# Patient Record
Sex: Female | Born: 1965 | Race: White | Hispanic: No | Marital: Married | Smoking: Former smoker
Health system: Southern US, Community
[De-identification: ages and names within clinical notes are randomized; demographics above are authoritative.]

## PROBLEM LIST (undated history)

## (undated) DIAGNOSIS — F419 Anxiety disorder, unspecified: Secondary | ICD-10-CM

## (undated) DIAGNOSIS — G43909 Migraine, unspecified, not intractable, without status migrainosus: Secondary | ICD-10-CM

## (undated) DIAGNOSIS — I10 Essential (primary) hypertension: Secondary | ICD-10-CM

## (undated) HISTORY — DX: Essential (primary) hypertension: I10

## (undated) HISTORY — DX: Anxiety disorder, unspecified: F41.9

## (undated) HISTORY — DX: Migraine, unspecified, not intractable, without status migrainosus: G43.909

---

## 2009-04-17 ENCOUNTER — Emergency Department: Payer: Self-pay | Admitting: Emergency Medicine

## 2009-08-05 ENCOUNTER — Emergency Department: Payer: Self-pay | Admitting: Emergency Medicine

## 2010-08-29 ENCOUNTER — Emergency Department: Payer: Self-pay | Admitting: Emergency Medicine

## 2012-10-12 ENCOUNTER — Emergency Department: Payer: Self-pay | Admitting: Emergency Medicine

## 2012-10-12 LAB — BASIC METABOLIC PANEL
BUN: 7 mg/dL (ref 7–18)
Calcium, Total: 9.6 mg/dL (ref 8.5–10.1)
Chloride: 101 mmol/L (ref 98–107)
Creatinine: 1 mg/dL (ref 0.60–1.30)
EGFR (African American): >60
EGFR (Non-African Amer.): >60
Glucose: 106 mg/dL — ABNORMAL HIGH (ref 65–99)
Osmolality: 267 (ref 275–301)
Potassium: 3.3 mmol/L — ABNORMAL LOW (ref 3.5–5.1)
Sodium: 134 mmol/L — ABNORMAL LOW (ref 136–145)

## 2012-10-12 LAB — TROPONIN I: Troponin-I: <0.02 ng/mL

## 2012-10-12 LAB — SEDIMENTATION RATE: Erythrocyte Sed Rate: 5 mm/hr (ref 0–20)

## 2012-10-12 LAB — CBC
MCH: 27.9 pg (ref 26.0–34.0)
WBC: 9.9 10*3/uL (ref 3.6–11.0)

## 2012-10-25 ENCOUNTER — Emergency Department: Payer: Self-pay | Admitting: Emergency Medicine

## 2012-10-25 LAB — COMPREHENSIVE METABOLIC PANEL
Alkaline Phosphatase: 110 U/L (ref 50–136)
Anion Gap: 10 (ref 7–16)
Creatinine: 0.8 mg/dL (ref 0.60–1.30)
EGFR (African American): >60
EGFR (Non-African Amer.): >60
Glucose: 108 mg/dL — ABNORMAL HIGH (ref 65–99)
SGOT(AST): 15 U/L (ref 15–37)
Total Protein: 7.2 g/dL (ref 6.4–8.2)

## 2012-10-25 LAB — CBC
HCT: 39.5 % (ref 35.0–47.0)
HGB: 13.4 g/dL (ref 12.0–16.0)
MCH: 28 pg (ref 26.0–34.0)
MCHC: 33.9 g/dL (ref 32.0–36.0)
Platelet: 469 10*3/uL — ABNORMAL HIGH (ref 150–440)
RBC: 4.78 10*6/uL (ref 3.80–5.20)
RDW: 16.3 % — ABNORMAL HIGH (ref 11.5–14.5)

## 2012-10-25 LAB — CK TOTAL AND CKMB (NOT AT ARMC): CK, Total: 47 U/L (ref 21–215)

## 2013-05-01 ENCOUNTER — Emergency Department: Payer: BC Managed Care – PPO

## 2013-05-01 ENCOUNTER — Emergency Department
Admission: EM | Admit: 2013-05-01 | Discharge: 2013-05-01 | Disposition: A | Discharge status: Home / Self Care | Payer: BC Managed Care – PPO | Attending: Emergency Medicine | Admitting: Emergency Medicine

## 2013-05-01 DIAGNOSIS — I10 Essential (primary) hypertension: Secondary | ICD-10-CM | POA: Insufficient documentation

## 2013-05-01 DIAGNOSIS — R45851 Suicidal ideations: Secondary | ICD-10-CM | POA: Insufficient documentation

## 2013-05-01 DIAGNOSIS — R51 Headache: Secondary | ICD-10-CM | POA: Insufficient documentation

## 2013-05-01 DIAGNOSIS — F3289 Other specified depressive episodes: Secondary | ICD-10-CM | POA: Insufficient documentation

## 2013-05-01 DIAGNOSIS — F32A Depression, unspecified: Secondary | ICD-10-CM

## 2013-05-01 LAB — HCG, SERUM, QUALITATIVE: BHCG Qualitative: NEGATIVE

## 2013-05-01 LAB — CBC AND DIFFERENTIAL
Basophils %: 0.5 % (ref 0.0–3.0)
Basophils Absolute: 0.1 10*3/uL (ref 0.0–0.3)
Eosinophils %: 0.4 % (ref 0.0–7.0)
Eosinophils Absolute: 0 10*3/uL (ref 0.0–0.8)
Hematocrit: 43.7 % (ref 36.0–48.0)
Hemoglobin: 14.7 gm/dL (ref 12.0–16.0)
Lymphocytes Absolute: 1.2 10*3/uL (ref 0.6–5.1)
Lymphocytes: 10.3 % — ABNORMAL LOW (ref 15.0–46.0)
MCH: 28 pg (ref 28–35)
MCHC: 34 gm/dL (ref 32–36)
MCV: 85 fL (ref 80–100)
MPV: 8.2 fL (ref 6.0–10.0)
Monocytes Absolute: 0.6 10*3/uL (ref 0.1–1.7)
Monocytes: 5.1 % (ref 3.0–15.0)
Neutrophils %: 83.7 % — ABNORMAL HIGH (ref 42.0–78.0)
Neutrophils Absolute: 10 10*3/uL — ABNORMAL HIGH (ref 1.7–8.6)
PLT CT: 379 10*3/uL (ref 130–440)
RBC: 5.17 10*6/uL — ABNORMAL HIGH (ref 3.80–5.00)
RDW: 13.4 % (ref 11.0–14.0)
WBC: 11.9 10*3/uL — ABNORMAL HIGH (ref 4.0–11.0)

## 2013-05-01 LAB — COMPREHENSIVE METABOLIC PANEL
ALT: 14 U/L (ref 0–55)
AST (SGOT): 14 U/L (ref 10–42)
Albumin/Globulin Ratio: 1.15 Ratio (ref 0.70–1.50)
Albumin: 3.8 gm/dL (ref 3.5–5.0)
Alkaline Phosphatase: 74 U/L (ref 40–145)
Anion Gap: 15.8 mMol/L (ref 7.0–18.0)
BUN / Creatinine Ratio: 8.5 Ratio — ABNORMAL LOW (ref 10.0–30.0)
BUN: 6 mg/dL — ABNORMAL LOW (ref 7–22)
Bilirubin, Total: 0.5 mg/dL (ref 0.1–1.2)
CO2: 20.1 mMol/L (ref 20.0–30.0)
Calcium: 10 mg/dL (ref 8.5–10.5)
Chloride: 106 mMol/L (ref 98–110)
Creatinine: 0.71 mg/dL (ref 0.60–1.20)
EGFR: >60 mL/min/{1.73_m2}
Globulin: 3.3 gm/dL (ref 2.0–4.0)
Glucose: 108 mg/dL — ABNORMAL HIGH (ref 70–99)
Osmolality Calc: 274 mOsm/kg — ABNORMAL LOW (ref 275–300)
Potassium: 3.9 mMol/L (ref 3.5–5.3)
Protein, Total: 7.1 gm/dL (ref 6.0–8.3)
Sodium: 138 mMol/L (ref 136–147)

## 2013-05-01 LAB — VH URINE DRUG SCREEN
Amphetamine: NEGATIVE
Barbiturates: NEGATIVE
Buprenorphine, Urine: NEGATIVE
Cannabinoids: NEGATIVE
Cocaine: NEGATIVE
Opiates: NEGATIVE
Phencyclidine: NEGATIVE
Urine Benzodiazepines: POSITIVE — AB
Urine Creatinine Random: 41.96 mg/dL
Urine Methadone Screen: NEGATIVE
Urine Oxycodone: NEGATIVE
Urine Specific Gravity: 1.004 (ref 1.001–1.040)
pH, Urine: 6.5 pH (ref 5.0–8.0)

## 2013-05-01 LAB — URINALYSIS
Bilirubin, UA: NEGATIVE
Blood, UA: NEGATIVE
Glucose, UA: NEGATIVE mg/dL
Ketones UA: NEGATIVE mg/dL
Leukocyte Esterase, UA: NEGATIVE Leu/uL
Nitrite, UA: NEGATIVE
Protein, UR: NEGATIVE mg/dL
RBC, UA: 2 /hpf (ref 0–5)
Squam Epithel, UA: 11 /hpf — ABNORMAL HIGH (ref 0–2)
Urine Specific Gravity: 1.004 (ref 1.001–1.040)
Urobilinogen, UA: NORMAL mg/dL
WBC, UA: <1 /hpf (ref 0–4)
pH, Urine: 7 pH (ref 5.0–8.0)

## 2013-05-01 LAB — TSH: TSH: 0.13 u[IU]/mL — ABNORMAL LOW (ref 0.40–4.20)

## 2013-05-01 LAB — ACETAMINOPHEN LEVEL: Acetaminophen Level: <3 ug/mL — ABNORMAL LOW (ref 10.0–30.0)

## 2013-05-01 LAB — SALICYLATE LEVEL: Salicylate Level: <5 mg/dL — ABNORMAL LOW (ref 5.0–30.0)

## 2013-05-01 LAB — ETHANOL: Alcohol: <10 mg/dL (ref 0–9)

## 2013-05-01 MED ORDER — TRAMADOL HCL 50 MG PO TABS
50.0000 mg | ORAL_TABLET | Freq: Three times a day (TID) | ORAL | Status: DC | PRN
Start: 2013-05-01 — End: 2013-05-24

## 2013-05-01 MED ORDER — TRAMADOL HCL 50 MG PO TABS
50.0000 mg | ORAL_TABLET | Freq: Once | ORAL | Status: AC
Start: 2013-05-01 — End: 2013-05-01
  Administered 2013-05-01: 50 mg via ORAL

## 2013-05-01 MED ORDER — METOPROLOL SUCCINATE ER 50 MG PO TB24
50.0000 mg | ORAL_TABLET | Freq: Every day | ORAL | Status: AC
Start: 2013-05-01 — End: ?

## 2013-05-01 MED ORDER — LORAZEPAM 0.5 MG PO TABS
ORAL_TABLET | ORAL | Status: AC
Start: 2013-05-01 — End: ?
  Filled 2013-05-01: qty 2

## 2013-05-01 MED ORDER — METOPROLOL TARTRATE 50 MG PO TABS
ORAL_TABLET | ORAL | Status: AC
Start: 2013-05-01 — End: ?
  Filled 2013-05-01: qty 1

## 2013-05-01 MED ORDER — LORAZEPAM 0.5 MG PO TABS
1.0000 mg | ORAL_TABLET | Freq: Once | ORAL | Status: AC
Start: 2013-05-01 — End: 2013-05-01
  Administered 2013-05-01: 1 mg via ORAL

## 2013-05-01 MED ORDER — METOPROLOL SUCCINATE ER 50 MG PO TB24
50.0000 mg | ORAL_TABLET | Freq: Once | ORAL | Status: AC
Start: 2013-05-01 — End: 2013-05-01
  Administered 2013-05-01: 50 mg via ORAL
  Filled 2013-05-01: qty 1

## 2013-05-01 MED ORDER — TRAMADOL HCL 50 MG PO TABS
ORAL_TABLET | ORAL | Status: AC
Start: 2013-05-01 — End: ?
  Filled 2013-05-01: qty 1

## 2013-05-01 NOTE — ED Provider Notes (Signed)
[facility] EMERGENCY DEPARTMENT History and Physical Exam      Patient Name: [name],Marie A  Encounter Date:  05/01/2013  Attending Physician: Myna Bright, MD  PCP: Christa See, MD  Patient DOB:  [birth year]  MRN:  [id number]  Room:  F81/W29-H      History of Presenting Illness     Chief complaint: Suicidal thoughts    HPI/ROS is limited by: none  HPI/ROS given by: patient and family    Location: general (suicidal thoughts)  Duration: 1 week  Severity: severe    Marie [name] is a 48 y.o. female who presents with depression and suicidal thoughts. The patient's husband states that her mother died last week. Since that time she has had severe depression. Her husband states that she has not gotten out of bed in the last 2 days. The patient states" I just don't want to be here". She states that she does not want to live without her mother. She is very upset because she feels that she is a bad mother and that she cannot even take care of herself. Her only physical complaint is of a headache. She denies any cough congestion or fever. She denies any attempt at harming herself. She has a history of panic attacks but no history of suicidal thoughts. She has no other specific complaints.      Review of Systems     Review of Systems   Constitutional: Negative for fever, chills, diaphoresis and fatigue.   HENT: Negative for congestion, dental problem, ear pain, nosebleeds and sore throat.    Eyes: Negative for pain, discharge, redness and itching.   Respiratory: Negative for cough, chest tightness, shortness of breath and wheezing.    Cardiovascular: Negative for chest pain, palpitations and leg swelling.   Gastrointestinal: Negative for nausea, vomiting, abdominal pain, diarrhea, constipation and blood in stool.   Genitourinary: Negative for dysuria, urgency, frequency, hematuria, flank pain, decreased urine volume and difficulty urinating.   Musculoskeletal: Negative for back pain, joint swelling, myalgias and  neck pain.   Skin: Negative for pallor, rash and wound.   Neurological: Positive for headaches. Negative for dizziness, syncope, speech difficulty, weakness and numbness.   Hematological: Negative for adenopathy. Does not bruise/bleed easily.   Psychiatric/Behavioral: Positive for suicidal ideas and dysphoric mood. Negative for confusion and agitation. The patient is nervous/anxious.    All other systems reviewed and are negative.       Allergies     Pt  has no known allergies.    Medications     Current Outpatient Rx   Name  Route  Sig  Dispense  Refill   . METOPROLOL SUCCINATE ER 50 MG PO TB24    Oral    Take 1 tablet (50 mg total) by mouth daily.    30 tablet    1     . TRAMADOL HCL ER 100 MG PO TB24    Oral    Take 100 mg by mouth as needed.             Marland Kitchen VALSARTAN 40 MG PO TABS    Oral    Take 40 mg by mouth daily.                  Past Medical History     Pt has a past medical history of Hypertension; Anxiety; and Migraine.    Past Surgical History     Pt has no past surgical history on file.  Family History     The family history is not on file.    Social History     Pt reports that she has been smoking.  She does not have any smokeless tobacco history on file. She reports that she drinks alcohol. She reports that she does not use illicit drugs.    Physical Exam     Blood pressure 183/124, pulse 97, temperature 97.9 F (36.6 C), resp. rate 18, weight 73.755 kg, last menstrual period 04/10/2013, SpO2 99.00%.    Physical Exam   Nursing note and vitals reviewed.  Constitutional: She is oriented to person, place, and time. She appears well-developed and well-nourished. She appears distressed (crying).   HENT:   Right Ear: External ear normal.   Left Ear: External ear normal.   Nose: Nose normal.   Mouth/Throat: Oropharynx is clear and moist. No oropharyngeal exudate.   Eyes: Conjunctivae normal and EOM are normal. Pupils are equal, round, and reactive to light. Right eye exhibits no discharge. Left eye  exhibits no discharge. No scleral icterus.   Neck: Normal range of motion. Neck supple. No JVD present. No tracheal deviation present. No thyromegaly present.   Cardiovascular: Regular rhythm and normal heart sounds.  Tachycardia present.    No murmur heard.  Pulmonary/Chest: Effort normal and breath sounds normal. No respiratory distress. She has no wheezes. She has no rales. She exhibits no tenderness.   Abdominal: Soft. Bowel sounds are normal. She exhibits no distension and no mass. There is no tenderness. There is no rebound and no guarding.   Musculoskeletal: Normal range of motion. She exhibits no edema and no tenderness.   Lymphadenopathy:     She has no cervical adenopathy.   Neurological: She is alert and oriented to person, place, and time. She has normal reflexes. She exhibits normal muscle tone. Coordination normal.   Skin: Skin is warm and dry. No rash noted. She is not diaphoretic. No erythema. No pallor.   Psychiatric: She has a normal mood and affect. Her behavior is normal. Judgment and thought content normal.     Orders Placed     Orders Placed This Encounter   Procedures   . CBC and differential   . Comprehensive metabolic panel   . Urinalysis   . Urine Drug Screen   . Acetaminophen level   . Salicylate level   . TSH   . Alcohol Level   . BHCG, Qualitative       Diagnostic Results       The results of the diagnostic studies below have been reviewed by myself:    Labs  Results     Procedure Component Value Units Date/Time    TSH [[id number]]  (Abnormal) Collected:05/01/13 1340    Specimen Information:Blood / Plasma Updated:05/01/13 1442     Thyroid Stimulating Hormone 0.13 (L) uIU/mL     Urine Drug Screen [[id number]]  (Abnormal) Collected:05/01/13 1340    Specimen Information:Urine, Random Updated:05/01/13 1432     Creatinine, UR 41.96 mg/dL      pH, Urine 6.5 pH      Specific Gravity, UR 1.004      Amphetamine Negative      Barbiturates Negative      Benzodiazepines Positive (A)      Cannabinoids  Negative      Cocaine Negative      Methadone Screen, Urine Negative      Opiates Negative      OXYCODONE, URINE Negative      Phencyclidine  Negative      Buprenorphine, Urine Negative     BHCG, Qualitative [[id number]] Collected:05/01/13 1340    Specimen Information:Blood / Plasma Updated:05/01/13 1428     BHCG Qual Negative     Comprehensive metabolic panel [[id number]]  (Abnormal) Collected:05/01/13 1340    Specimen Information:Blood / Plasma Updated:05/01/13 1421     Sodium 138 mMol/L      Potassium 3.9 mMol/L      Chloride 106 mMol/L      CO2 20.1 mMol/L      CALCIUM 10.0 mg/dL      Glucose 782 (H) mg/dL      Creatinine 9.56 mg/dL      BUN 6 (L) mg/dL      Protein, Total 7.1 gm/dL      Albumin 3.8 gm/dL      Alkaline Phosphatase 74 U/L      ALT 14 U/L      AST (SGOT) 14 U/L      Bilirubin, Total 0.5 mg/dL      Albumin/Globulin Ratio 1.15 Ratio      Anion Gap 15.8 mMol/L      BUN/Creatinine Ratio 8.5 (L) Ratio      EGFR >60 mL/min/1.93m2      Osmolality Calc 274 (L) mOsm/kg      Globulin 3.3 gm/dL     Acetaminophen level [[id number]]  (Abnormal) Collected:05/01/13 1340    Specimen Information:Blood / Plasma Updated:05/01/13 1421     Acetaminophen Level <3.0 (L) mcg/mL     Salicylate level [[id number]]  (Abnormal) Collected:05/01/13 1340    Specimen Information:Blood / Plasma Updated:05/01/13 1421     Salicylate Level <5.0 (L) mg/dL     Alcohol Level [[id number]] Collected:05/01/13 1340    Specimen Information:Blood / Plasma Updated:05/01/13 1421     Alcohol <10 mg/dL     Urinalysis [[id number]]  (Abnormal) Collected:05/01/13 1340    Specimen Information:Urine / Urine, Random Updated:05/01/13 1403     Color, UA Yellow      Clarity, UA Slightly Cloudy (A)      Specific Gravity, UR 1.004      pH, Urine 7.0 pH      Protein, UR Negative mg/dL      Glucose, UA Negative mg/dL      Ketones UA Negative mg/dL      Bilirubin, UA Negative      Blood, UA Negative      Nitrite, UA Negative      Urobilinogen, UA Normal mg/dL       Leukocyte Esterase, UA Negative Leu/uL      WBC, UA <1 /hpf      RBC, UA 2 /hpf      Squam Epithel, UA 11 (H) /hpf     CBC and differential [[id number]]  (Abnormal) Collected:05/01/13 1340    Specimen Information:Blood / Blood Updated:05/01/13 1402     WBC 11.9 (H) K/cmm      RBC 5.17 (H) M/cmm      Hemoglobin 14.7 gm/dL      Hematocrit 63.8 %      MCV 85 fL      MCH 28 pg      MCHC 34 gm/dL      RDW 75.6 %      PLT CT 379 K/cmm      MPV 8.2 fL      NEUTROPHIL % 83.7 (H) %      Lymphocytes 10.3 (L) %      Monocytes 5.1 %      Eosinophils %  0.4 %      Basophils % 0.5 %      Neutrophils Absolute 10.0 (H) K/cmm      Lymphocytes Absolute 1.2 K/cmm      Monocytes Absolute 0.6 K/cmm      Eosinophils Absolute 0.0 K/cmm      BASO Absolute 0.1 K/cmm           Radiologic Studies  Radiology Results (24 Hour)     ** No Results found for the last 24 hours. **            MDM / Critical Care     Blood pressure 183/124, pulse 97, temperature 97.9 F (36.6 C), resp. rate 18, weight 73.755 kg, last menstrual period 04/10/2013, SpO2 99.00%.  This patient presented with a psychiatric condition.  The patient was evaluated and their psychiatric illness has been determined by the ED MD along with appropriate ancillary mental health services, to be stable for outpatient management.  Differential diagnosis included but was not limited to depression, schizophrenia, bipolar disorder, psychosis, and suicidal or homicidal risk. The patient currently does not exhibit active suicidal or homicidal ideation and is felt to be at low risk for a suicide attempt.  The patient is not gravely disabled and has the means to obtain food and shelter.  Referrals to outpatient mental health resources have been provided prior to discharge and the patient has also been instructed to return immediately if any acute worsening their mental health occurs.  They have also been counseled to abstain from alcohol or drugs.  Diagnostic impression and plan were discussed  with the patient and/or family.  Results of lab/radiology tests were reviewed and discussed with the patient and/or family. All questions were answered and concerns addressed.  The patient was evaluated by crisis care who discussed the case with the on-call psychiatrist. They felt that the patient was grieving and did not have true suicidal thoughts and that she could be of followed and treated as an outpatient. The family was comfortable with this plan. Patient is noted to have elevated blood pressure. She states she is out of all of her prescriptions and has not been taking medications. She was written for Toprol-XL 50 mg daily and given a referral for family practice follow-up.    Procedures             Diagnosis / Disposition     Clinical Impression  1. Depression    2. Hypertension        Disposition  ED Disposition     Discharge Marie [name] discharge to home/self care.    Condition at disposition: Stable            Prescriptions  New Prescriptions    METOPROLOL XL (TOPROL-XL) 50 MG 24 HR TABLET    Take 1 tablet (50 mg total) by mouth daily.         In addition to the above history, please see nursing notes.  Allergies, meds, past medical, family, social, history and the results from the diagnostic studies performed have been reviewed by myself.     This note has been created by an Electronic Medical Record that may contain additions or subtractions not intended by myself.  Myna Bright, MD              Myna Bright, MD  05/01/13 [phone]

## 2013-05-01 NOTE — ED Notes (Signed)
Bed:W44-A<BR> Expected date:<BR> Expected time:<BR> Means of arrival:<BR> Comments:<BR> Suicidal at triage

## 2013-05-01 NOTE — ED Notes (Signed)
Pt OOB for BRp--ambulating slowly but without acute difficulty--escorted by Foye Clock EDT. Instructed in clean catch ua.

## 2013-05-01 NOTE — ED Notes (Signed)
ERMD Hendren informed of pt's headache complaint and BP. No orders at this time.

## 2013-05-01 NOTE — ED Notes (Signed)
Pt resting quietly in darkened room as nurse entered room, pt cooperative. Continues with despair with interaction and complaints of headache.

## 2013-05-01 NOTE — ED Notes (Signed)
Pt seen and assessed. Will speak with husband- he is on his way back to the hospital.  Disposition pending review with Dr. Tamera Punt.

## 2013-05-01 NOTE — ED Notes (Signed)
Crisis Care at bedside talking with pt.

## 2013-05-01 NOTE — Discharge Instructions (Signed)
Controlling High Blood Pressure  High blood pressure (hypertension) is called the silent killer. This is because many people who have it don't know it. Normal blood pressure is less than 120/80. Know your blood pressure and remember to check it regularly. Doing so can save your life. Here are some things you can do to help control your blood pressure.    Choose heart-healthy foods   Select low-salt, low-fat foods.   Limit canned, dried, cured, packaged, and fast foods. These can contain a lot of salt.   Eat 8-10 servings of fruits and vegetables every day.   Choose lean meats, fish, or chicken.   Eat whole-grain pasta, brown rice, and beans.   Eat 2-3 servings of low-fat or fat-free dairy products   Ask your doctor about the DASH eating plan. This plan helps reduce blood pressure.  Maintain a healthy weight   Ask your healthcare provider how many calories to eat a day. Then stick to that number.   Ask your healthcare provider what weight range is healthiest for you. If you are overweight, weight loss of only 10 lbs can help lower blood pressure.   Limit snacks and sweets.   Get regular exercise.  Get up and get active   Choose activities you enjoy. Find ones you can do with friends or family.   Park farther away from building entrances.   Use stairs instead of the elevator.   When you can, walk or bike instead of driving.   Rake leaves, garden, or do household repairs.   Be active for at least 30 minutes a day, most days of the week.  Manage stress   Make time to relax and enjoy life. Find time to laugh.   Visit with family and friends, and keep up with hobbies.  Limit alcohol and quit smoking   Men: Have no more than 2 drinks per day.   Women: Have no more than 1 drink per day.   Talk with your healthcare provider about quitting smoking. Smoking increases your risk for heart disease and stroke. Ask about local or community programs that can help.  Medications  If lifestyle changes aren't  enough, your healthcare provider may prescribe high blood pressure medicine. Take all medications as prescribed.    2000-2014 [employer], [address], [place] [address]. All rights reserved. This information is not intended as a substitute for professional medical care. Always follow your healthcare professional's instructions.

## 2013-05-01 NOTE — ED Notes (Signed)
Safety sitter at bedside 

## 2013-05-01 NOTE — ED Notes (Signed)
Pt reported to Crisis Care [facility] Nurse she takes Ultram prn for headaches; this was not reported as history at time of initial assessment.

## 2013-05-01 NOTE — ED Notes (Signed)
Pt tearful on arrival to room; no eye contact. Resting in right side lying position on stretcher during review of history and reason for current ED visit. Answering some questions then deferring to her spouse at bedside.  Pt states "feel like I'm having an anxiety attack" and "I'm depressed. Spouse states pt has been depressed since pt's mother died 1 week ago; pt has been depressed in the past but not to this degree. Pt states "i don't want to live without her." Denies SI, spouse states "That's not what she has told me--has admitted to SI." with no POA. Pt denies SI or POA here. Denies hallucinations "Just having bad dreams." and "I feel bad I can't take care of myself or my family." Pt states decreased appetite 3-4 daysand hasn't eaten in 2-3 days.  Bed in low and locked position,rails up X1, calbell in reach.

## 2013-05-24 ENCOUNTER — Encounter (INDEPENDENT_AMBULATORY_CARE_PROVIDER_SITE_OTHER): Payer: Self-pay

## 2013-05-24 ENCOUNTER — Ambulatory Visit (INDEPENDENT_AMBULATORY_CARE_PROVIDER_SITE_OTHER): Payer: BC Managed Care – PPO | Admitting: Family Medicine

## 2013-05-24 VITALS — BP 132/84 | HR 88 | Temp 97.9°F | Resp 17 | Ht 67.0 in | Wt 156.0 lb

## 2013-05-24 DIAGNOSIS — R51 Headache: Secondary | ICD-10-CM

## 2013-05-24 DIAGNOSIS — R110 Nausea: Secondary | ICD-10-CM

## 2013-05-24 DIAGNOSIS — R519 Headache, unspecified: Secondary | ICD-10-CM

## 2013-05-24 MED ORDER — ONDANSETRON 4 MG PO TBDP
4.0000 mg | ORAL_TABLET | Freq: Once | ORAL | Status: AC
Start: 2013-05-24 — End: 2013-05-24
  Administered 2013-05-24: 4 mg via ORAL

## 2013-05-24 MED ORDER — ONDANSETRON 8 MG PO TBDP
8.0000 mg | ORAL_TABLET | Freq: Three times a day (TID) | ORAL | Status: DC | PRN
Start: 2013-05-24 — End: 2014-12-27

## 2013-05-24 MED ORDER — KETOROLAC TROMETHAMINE 60 MG/2ML IM SOLN
60.0000 mg | Freq: Once | INTRAMUSCULAR | Status: AC
Start: 2013-05-24 — End: 2013-05-24
  Administered 2013-05-24: 60 mg via INTRAMUSCULAR

## 2013-05-24 MED ORDER — TRAMADOL HCL 50 MG PO TABS
50.0000 mg | ORAL_TABLET | Freq: Three times a day (TID) | ORAL | Status: DC | PRN
Start: 2013-05-24 — End: 2014-12-27

## 2013-05-24 NOTE — Progress Notes (Signed)
Subjective:    Patient ID: Marie Fields is a 48 y.o. female.    Headache   This is a new problem. The current episode started today. The problem occurs constantly. The problem has been unchanged. The pain is located in the bilateral region. The quality of the pain is described as aching. The pain is moderate. Associated symptoms include phonophobia and photophobia. Pertinent negatives include no abdominal pain, abnormal behavior, anorexia, back pain, blurred vision, coughing, dizziness, drainage, ear pain, eye pain, eye redness, eye watering, facial sweating, fever, hearing loss, insomnia, loss of balance, muscle aches, nausea, neck pain, numbness, rhinorrhea, scalp tenderness, seizures, sinus pressure, sore throat, swollen glands, tingling, tinnitus, visual change, vomiting, weakness or weight loss. The symptoms are aggravated by emotional stress. Her past medical history is significant for migraine headaches. There is no history of cancer, cluster headaches, hypertension, immunosuppression, migraines in the family, obesity, pseudotumor cerebri, recent head traumas, sinus disease or TMJ.       The following portions of the patient's history were reviewed and updated as appropriate: past surgical history and problem list.    Review of Systems   Constitutional: Negative for fever, chills, weight loss, diaphoresis, activity change, appetite change and fatigue.   HENT: Negative for congestion, drooling, ear discharge, ear pain, hearing loss, mouth sores, postnasal drip, rhinorrhea, sinus pressure, sneezing, sore throat, tinnitus, trouble swallowing and voice change.    Eyes: Positive for photophobia. Negative for blurred vision, pain and redness.   Respiratory: Negative for cough, choking, chest tightness, shortness of breath, wheezing and stridor.    Cardiovascular: Negative for chest pain, palpitations and leg swelling.   Gastrointestinal: Negative for nausea, vomiting, abdominal pain, diarrhea, constipation  and anorexia.   Genitourinary: Negative for dysuria, urgency, frequency, decreased urine volume, vaginal bleeding, vaginal discharge, enuresis and vaginal pain.   Musculoskeletal: Negative for back pain and neck pain.   Skin: Negative for rash.   Neurological: Positive for headaches. Negative for dizziness, tingling, seizures, syncope, weakness, numbness and loss of balance.   Psychiatric/Behavioral: The patient does not have insomnia.          Objective:    BP 132/84   Pulse 88   Temp(Src) 97.9 F (36.6 C)   Resp 17   Ht 1.702 m (5\' 7" )   Wt 70.761 kg (156 lb)   BMI 24.43 kg/m2     LMP 05/24/2013       Physical Exam   Constitutional: She is oriented to person, place, and time. She appears well-developed and well-nourished. No distress.   HENT:   Head: Normocephalic and atraumatic.   Right Ear: External ear normal.   Left Ear: External ear normal.   Mouth/Throat: Oropharynx is clear and moist. No oropharyngeal exudate.   Eyes: Conjunctivae and EOM are normal. Pupils are equal, round, and reactive to light. Right eye exhibits no discharge. Left eye exhibits no discharge.   Neck: Normal range of motion. Neck supple.   Cardiovascular: Normal rate, regular rhythm and normal heart sounds.    No murmur heard.  Pulmonary/Chest: Effort normal and breath sounds normal. No respiratory distress. She has no wheezes. She has no rales.   Musculoskeletal: She exhibits no edema.   Lymphadenopathy:     She has no cervical adenopathy.   Neurological: She is alert and oriented to person, place, and time. She has normal strength. No cranial nerve deficit or sensory deficit.   Skin: Skin is warm. No rash noted. She is not diaphoretic.  Psychiatric: Her mood appears anxious.   Pt was crying, states has  lots of stress. Mom died recently. Denies being suicidal/homicidal         Assessment and Plan:       Marie Fields was seen today for headache.    Diagnoses and associated orders for this visit:    Headache  - ondansetron  (ZOFRAN-ODT) disintegrating tablet 4 mg; Take 1 tablet (4 mg total) by mouth once.    - ketorolac (TORADOL) injection 60 mg; Inject 2 mLs (60 mg total) into the muscle once.        PAIN IS BETTER AFTER TORADOL INJECTION.    list of PCP PROVIDED  Advised plenty of liquids,tylenol/ibuprofen PRN for pain.F/U with PCP/urgent care soon if symptoms persists/gets worse more than 2  days.  Advise to watch for worsening symptoms,with those seek medical help immediatly  F/u with PCP  F/u PRN      Etheleen Sia, MD  [facility]  05/24/2013  3:55 PM

## 2013-05-24 NOTE — Patient Instructions (Signed)
Headache [Unspecified]    The cause of your headache today is not clear, but it does not appear to be the sign of any serious illness.  Under stress, some people tense the muscles of their shoulder, neck and scalp without knowing it. If this condition lasts long enough, a TENSION HEADACHE can occur.  A MIGRAINE HEADACHE is caused by changes in blood flow to the brain. A migraine attack may be triggered by emotional stress, hormone changes during the menstrual cycle, oral contraceptives, alcohol use, certain foods containing tyramine, eye strain, weather changes, missing meals, lack of sleep or oversleeping.  Other causes of headache include a viral illness with high fever, head injury with concussion, sinus, ear or throat infection, dental pain and TMJ (jaw joint) pain. More serious but less common causes of headache include stroke, brain hemorrhage, brain tumor, meningitis and encephalitis.  Home Care:   If you were given pain medicine for this headache, do not drive yourself home. Arrange for a ride, instead. When you get home, try to sleep. You should feel much better when you wake up.   Apply heat to the back of your neck to relieve neck muscle spasm. Migraine headaches may respond best to an ice pack on the forehead or at the base of the skull.   If you are having nausea or vomiting, follow a light diet until your headache is relieved.   If you have a migraine type headache, use sunglasses when in the daylight or around bright indoor lighting until symptoms improve. Bright glaring light can worsen this kind of headache.  Follow Up  with your doctor if the headache is not better within the next 24 hours. If you have frequent headaches you should discuss a treatment plan with your primary care doctor. By being aware of the earliest signs of headache, and starting treatment right away, you may be able to stop the pain yourself.  Get Prompt Medical Attention  if any of the following occur:   Worsening of  your head pain or no improvement within 24 hours   Repeated vomiting (unable to keep liquids down)   Fever of 100.4F (38C) or higher, or as directed by your healthcare provider   Stiff neck   Extreme drowsiness, confusion or fainting   Dizziness, vertigo (dizziness with spinning sensation)   Weakness of an arm or leg or one side of the face   Difficulty with speech or vision   2000-2014 [employer], [address], [place] [address]. All rights reserved. This information is not intended as a substitute for professional medical care. Always follow your healthcare professional's instructions.

## 2013-05-24 NOTE — Progress Notes (Signed)
Medication given as directed and injection given as directed patient did not have any complaints

## 2013-08-27 ENCOUNTER — Observation Stay: Payer: Self-pay | Admitting: Specialist

## 2013-08-27 LAB — COMPREHENSIVE METABOLIC PANEL
ALT: 23 U/L
Albumin: 3.8 g/dL (ref 3.4–5.0)
Alkaline Phosphatase: 87 U/L
Anion Gap: 12 (ref 7–16)
BILIRUBIN TOTAL: 0.4 mg/dL (ref 0.2–1.0)
BUN: 6 mg/dL — ABNORMAL LOW (ref 7–18)
CREATININE: 0.99 mg/dL (ref 0.60–1.30)
Calcium, Total: 8.7 mg/dL (ref 8.5–10.1)
Chloride: 94 mmol/L — ABNORMAL LOW (ref 98–107)
Co2: 21 mmol/L (ref 21–32)
EGFR (African American): >60
EGFR (Non-African Amer.): >60
Glucose: 140 mg/dL — ABNORMAL HIGH (ref 65–99)
Osmolality: 255 (ref 275–301)
POTASSIUM: 3.1 mmol/L — AB (ref 3.5–5.1)
SGOT(AST): 16 U/L (ref 15–37)
Sodium: 127 mmol/L — ABNORMAL LOW (ref 136–145)
Total Protein: 7.4 g/dL (ref 6.4–8.2)

## 2013-08-27 LAB — PREGNANCY, URINE: Pregnancy Test, Urine: NEGATIVE m[IU]/mL

## 2013-08-27 LAB — CBC WITH DIFFERENTIAL/PLATELET
BASOS PCT: 0.8 %
Basophil #: 0.1 10*3/uL (ref 0.0–0.1)
EOS PCT: 0.9 %
Eosinophil #: 0.1 10*3/uL (ref 0.0–0.7)
HCT: 44.6 % (ref 35.0–47.0)
HGB: 14.7 g/dL (ref 12.0–16.0)
LYMPHS ABS: 2.2 10*3/uL (ref 1.0–3.6)
Lymphocyte %: 19.5 %
MCH: 28.5 pg (ref 26.0–34.0)
MCHC: 32.8 g/dL (ref 32.0–36.0)
MCV: 87 fL (ref 80–100)
Monocyte #: 1.1 x10 3/mm — ABNORMAL HIGH (ref 0.2–0.9)
Monocyte %: 9.5 %
NEUTROS PCT: 69.3 %
Neutrophil #: 7.8 10*3/uL — ABNORMAL HIGH (ref 1.4–6.5)
Platelet: 449 10*3/uL — ABNORMAL HIGH (ref 150–440)
RBC: 5.15 10*6/uL (ref 3.80–5.20)
RDW: 14.6 % — ABNORMAL HIGH (ref 11.5–14.5)
WBC: 11.3 10*3/uL — AB (ref 3.6–11.0)

## 2013-08-27 LAB — URINALYSIS, COMPLETE
Bacteria: NONE SEEN
Bilirubin,UR: NEGATIVE
Blood: NEGATIVE
Glucose,UR: NEGATIVE mg/dL (ref 0–75)
KETONE: NEGATIVE
LEUKOCYTE ESTERASE: NEGATIVE
NITRITE: NEGATIVE
PROTEIN: NEGATIVE
Ph: 6 (ref 4.5–8.0)
RBC,UR: NONE SEEN /HPF (ref 0–5)
SPECIFIC GRAVITY: 1.004 (ref 1.003–1.030)
Squamous Epithelial: <1
WBC UR: NONE SEEN /HPF (ref 0–5)

## 2013-08-27 LAB — PROTIME-INR
INR: 0.9
PROTHROMBIN TIME: 12.3 s (ref 11.5–14.7)

## 2013-08-27 LAB — TROPONIN I

## 2013-08-27 LAB — MAGNESIUM: Magnesium: 1.4 mg/dL — ABNORMAL LOW

## 2013-08-27 LAB — APTT: ACTIVATED PTT: 30.9 s (ref 23.6–35.9)

## 2013-08-28 LAB — BASIC METABOLIC PANEL
Anion Gap: 5 — ABNORMAL LOW (ref 7–16)
BUN: 5 mg/dL — ABNORMAL LOW (ref 7–18)
CALCIUM: 7.9 mg/dL — AB (ref 8.5–10.1)
CO2: 24 mmol/L (ref 21–32)
CREATININE: 0.87 mg/dL (ref 0.60–1.30)
Chloride: 106 mmol/L (ref 98–107)
EGFR (African American): >60
Glucose: 110 mg/dL — ABNORMAL HIGH (ref 65–99)
Osmolality: 268 (ref 275–301)
Potassium: 4.1 mmol/L (ref 3.5–5.1)
SODIUM: 135 mmol/L — AB (ref 136–145)

## 2013-08-28 LAB — LIPID PANEL
Cholesterol: 179 mg/dL (ref 0–200)
HDL: 44 mg/dL (ref 40–60)
Ldl Cholesterol, Calc: 100 mg/dL (ref 0–100)
Triglycerides: 177 mg/dL (ref 0–200)
VLDL CHOLESTEROL, CALC: 35 mg/dL (ref 5–40)

## 2013-08-28 LAB — OSMOLALITY, URINE: Osmolality: 64 mOsm/kg

## 2013-08-28 LAB — SODIUM, URINE, RANDOM: SODIUM, URINE RANDOM: 19 mmol/L (ref 20–110)

## 2014-05-06 NOTE — Discharge Summary (Signed)
PATIENT NAME:  Theresa Stone, Theresa Stone DATE OF BIRTH:  10-12-1965  DATE OF ADMISSION:  08/27/2013 DATE OF DISCHARGE:  08/28/2013  For a detailed note please take a look at the history and physical done on admission by Dr. Angelica Ranavid Hower.   DIAGNOSES AT DISCHARGE: As follows: Migraines. Malignant hypertension.  Anxiety.   DIET: The patient is being discharged on a low-sodium diet.   ACTIVITY: As tolerated.   FOLLOWUP: Follow up with her primary care physician in the next 1-2 weeks.   DISCHARGE MEDICATIONS: Metoprolol succinate 25 mg daily, Paxil 20 mg daily.   LABORATORY DATA: Pertinent studies done during the hospital course are as follows: CT scan of the head done without contrast showing no acute intracranial abnormalities. An MRI of the brain done without contrast showing no acute intracranial abnormality, tiny remote cerebellar infarct. Ultrasound of the carotids showing no hemodynamically significant carotid artery stenosis.   HOSPITAL COURSE: This is a 49 year old female with medical problems as mentioned above, presented to the hospital due to headache and upper extremity paresthesias, suspicious for a possible stroke.   PROBLEMS: 1. Headache with upper extremity paresthesias. The most likely cause of this was probably a complex migraine. The patient has a history of migraines. The patient underwent an extensive workup for a possible stroke including a CT of head and MRI of brain and a carotid duplex, all of which were essentially negative. She still continues to have a headache, but her paresthesias are resolved. Since her imaging studies and her clinical symptoms have improved. She is being discharged home.  2. Malignant hypertension. The patient's systolic blood pressures were significantly elevated with systolics over 200 upon admission. They improved with some p.r.n. hydralazine. The patient is likely noncompliant with her medications although she does say that she has  been taking them. At present she will continue her Toprol as stated. Further titrations to her antihypertensives can be done as an outpatient by her primary care physician.  3. Anxiety. The patient was maintained on her Xanax in the hospital. She will continue on Paxil at home. 4. Hyponatremia. This was likely hypovolemic hyponatremia. The patient was given some IV fluids and her sodium since then has improved and resolved now.   CODE STATUS: The patient is a full code.   She is being discharged home.   TIME SPENT ON DISCHARGE: 40 minutes.    ____________________________ Rolly PancakeVivek J. Cherlynn KaiserSainani, MD vjs:lt D: 08/28/2013 13:46:03 ET T: 08/28/2013 15:22:49 ET JOB#: [id number]  cc: Rolly PancakeVivek J. Cherlynn KaiserSainani, MD, <Dictator> Houston SirenVIVEK J Garrell Flagg MD ELECTRONICALLY SIGNED 08/30/2013 16:02

## 2014-05-06 NOTE — H&P (Signed)
PATIENT NAME:  Theresa Stone, FILYAW MR#:  [id number] DATE OF BIRTH:  Apr 12, 1965  DATE OF ADMISSION:  08/27/2013  REFERRING PHYSICIAN: Mont Dutton, PA-C   PRIMARY CARE PHYSICIAN: Dr. Rolm Gala   CHIEF COMPLAINT: Weakness.   HISTORY OF PRESENT ILLNESS: A 49 year old Caucasian female with a history of hypertension as well as migraines, presenting with left-sided weakness. Describes acute onset of left-sided weakness of the upper and lower extremities, which occurred originally around 11:00 a.m. on 08/27/2013 with associated headache. She describes headache as global, throbbing in quality, 8 out of 10 in intensity. No worsening or relieving factors. She also describes having paresthesias and numbness over the same left extremities, which prompted arrival to the Emergency Department. On arrival, she was noted to be markedly hypertensive, blood pressure 215/99. She has subsequently had some improvement of her blood pressure as well as improvement of her symptomatology.  REVIEW OF SYSTEMS:  CONSTITUTIONAL: Denies fever, fatigue. Positive for weakness as described above.  EYES: Denied blurred vision, double vision, or eye pain.  EARS, NOSE, THROAT: Denies tinnitus, ear pain, hearing loss.  RESPIRATORY: Denies cough or shortness of breath. CARDIOVASCULAR: No chest pain, palpitations, edema.  GASTROINTESTINAL: Denies nausea, vomiting, diarrhea, abdominal pain. GENITOURINARY: Denies dysuria or hematuria. ENDOCRINE: Denies nocturia or thyroid problems.  HEMATOLOGIC AND LYMPHATIC: Denies easy bruising, bleeding.  SKIN: Denies rashes, lesions.  MUSCULOSKELETAL: Denies pain in neck, back, shoulder, knees, hips or arthritic symptoms.  NEUROLOGIC: Positive for paresthesias, numbness, headache as described above, as well as left-sided weakness.  PSYCHIATRIC: Denies anxiety or depressive symptoms. Otherwise, full review of systems performed by me is negative is negative.   PAST MEDICAL HISTORY:  Hypertension and migraine.   SOCIAL HISTORY: Positive for tobacco use as well as occasional alcohol use. Denies any drug usage.   FAMILY HISTORY: Positive for coronary artery disease. No history of stroke.   ALLERGIES: Denies any known drug allergies.   HOME MEDICATIONS: She is unsure of her medications. States she does take something for blood pressure, however, does not know what that is. Per our documentation, she was previously on atenolol. Once again, she is unsure of her medications.   PHYSICAL EXAMINATION: VITAL SIGNS: Temperature 98, heart rate 87, respirations 21, blood pressure on arrival 215/99, currently 182/92, saturating 100% on room air.   GENERAL: Well-nourished, well-developed, Caucasian female, currently in no acute distress.  HEAD: Normocephalic, atraumatic.  EYES: Pupils are equal, round, and reactive to light. Extraocular movements intact. No scleral icterus.  MOUTH: Moist mucosal membrane. Dentition intact. No abscess noted.  EAR, NOSE, THROAT: Clear, without exudates. No external lesions.  NECK: Supple. No thyromegaly. No nodules. No JVD.  PULMONARY: Clear to auscultation bilaterally without wheezes, rales, rhonchi. No use of accessory muscles. Good respiratory effort.  CHEST: Nontender to palpation.  CARDIOVASCULAR: S1, S2. Regular rate and rhythm. No murmurs, rubs, or gallops. No edema. Pedal pulses 2+ bilaterally.  GASTROINTESTINAL: Soft, nontender, nondistended. No masses. Positive bowel sounds. No hepatosplenomegaly.  MUSCULOSKELETAL: No swelling, clubbing, or edema. Range of motion full in all extremities.  NEUROLOGIC: Cranial nerves II through XII intact. Strength is somewhat diminished throughout, secondary to effort; however, after much prompting, strength 5/5 in upper and lower right extremity, both proximal and distal, flexion and extension; strength 4/5 in the left hand grip, otherwise 5/5 in all extremities, including proximal and distal flexion and  extension. Pronator drift within normal limits. Romberg and gait deferred at this time.  SKIN: No ulceration, lesions, rashes, or cyanosis. Skin warm, dry.  Turgor intact.  PSYCHIATRIC: Mood and affect blunted. She is awake, alert, oriented x3. Insight and judgment intact.   LABORATORY DATA: Chest x-ray performed: No acute intracranial process. She had a CT head performed, which shows no acute intracranial process. Remainder of laboratory data: Sodium 127, potassium 3.1, chloride 94, bicarbonate 21, BUN 6, creatinine 0.99, glucose 140. Magnesium 1.4. LFTs within normal limits. WBC 11.3, hemoglobin 14.7, platelets of 449. Urinalysis negative for evidence of infection.   ASSESSMENT AND PLAN: A 49 year old Caucasian female with history of hypertension, migraines, presenting with left-sided weakness.   1.  Hypertensive crisis with neurological deficit. Original blood pressure on arrival was 215/99. With some improvement of her blood pressure, has had improvement of her symptoms. Her goal blood pressure should be less than 180/100. Will initiate hydralazine 10 mg intravenous q.4h. as needed for blood pressure control; however, we will also work for stroke rule out. Initiate aspirin and statin therapy, provide neurology checks q.4 hours, check MRI, lipid panel and carotid Doppler. We will consult neurology.  2.  Hyponatremia, unclear etiology. Could be related to syndrome of inappropriate antidiuretic hormone or neurological etiology. We will check urine sodium and osmolality. Her sodium deficit is around 340 mEq of sodium, to correct to a sodium of 137, provide normal saline at 50 mL an hour for IV fluid hydration.  3.  Hypokalemia. Replace potassium to goal of 4 to 5.  4.  Hypomagnesemia. Replace to goal of 2. 5.  Deep venous thrombosis prophylaxis with sequential compression devices.  CODE STATUS: Full code.  TIME SPENT: 45 minutes.   ____________________________ Cletis Athensavid K. Hower,  MD dkh:cg D: 08/27/2013 22:57:55 ET T: 08/27/2013 23:49:59 ET JOB#: [id number]  cc: Cletis Athensavid K. Hower, MD, <Dictator> DAVID Synetta ShadowK HOWER MD ELECTRONICALLY SIGNED 08/28/2013 20:55

## 2014-07-25 ENCOUNTER — Emergency Department
Admission: EM | Admit: 2014-07-25 | Discharge: 2014-07-25 | Disposition: A | Discharge status: Home / Self Care | Payer: BLUE CROSS/BLUE SHIELD | Attending: Emergency Medicine | Admitting: Emergency Medicine

## 2014-07-25 ENCOUNTER — Other Ambulatory Visit: Payer: Self-pay

## 2014-07-25 ENCOUNTER — Encounter: Payer: Self-pay | Admitting: *Deleted

## 2014-07-25 DIAGNOSIS — G43111 Migraine with aura, intractable, with status migrainosus: Secondary | ICD-10-CM | POA: Insufficient documentation

## 2014-07-25 DIAGNOSIS — I10 Essential (primary) hypertension: Secondary | ICD-10-CM | POA: Diagnosis not present

## 2014-07-25 DIAGNOSIS — R51 Headache: Secondary | ICD-10-CM | POA: Diagnosis present

## 2014-07-25 DIAGNOSIS — Z720 Tobacco use: Secondary | ICD-10-CM | POA: Diagnosis not present

## 2014-07-25 HISTORY — DX: Essential (primary) hypertension: I10

## 2014-07-25 MED ORDER — LORAZEPAM 2 MG/ML IJ SOLN
INTRAMUSCULAR | Status: AC
  Administered 2014-07-25: 0.5 mg via INTRAVENOUS
  Filled 2014-07-25: qty 1

## 2014-07-25 MED ORDER — ONDANSETRON 4 MG PO TBDP
4.0000 mg | ORAL_TABLET | Freq: Once | ORAL | Status: AC
  Administered 2014-07-25: 4 mg via ORAL

## 2014-07-25 MED ORDER — METOCLOPRAMIDE HCL 5 MG/ML IJ SOLN
INTRAMUSCULAR | Status: AC
  Administered 2014-07-25: 10 mg via INTRAVENOUS
  Filled 2014-07-25: qty 2

## 2014-07-25 MED ORDER — DEXAMETHASONE SODIUM PHOSPHATE 10 MG/ML IJ SOLN
10.0000 mg | Freq: Once | INTRAMUSCULAR | Status: AC
  Administered 2014-07-25: 10 mg via INTRAVENOUS

## 2014-07-25 MED ORDER — MAGNESIUM SULFATE 2 GM/50ML IV SOLN
INTRAVENOUS | Status: AC
  Administered 2014-07-25: 2 g via INTRAVENOUS
  Filled 2014-07-25: qty 50

## 2014-07-25 MED ORDER — SODIUM CHLORIDE 0.9 % IV BOLUS (SEPSIS)
1000.0000 mL | Freq: Once | INTRAVENOUS | Status: AC
  Administered 2014-07-25: 1000 mL via INTRAVENOUS

## 2014-07-25 MED ORDER — MAGNESIUM SULFATE 2 GM/50ML IV SOLN
2.0000 g | Freq: Once | INTRAVENOUS | Status: AC
  Administered 2014-07-25: 2 g via INTRAVENOUS
  Filled 2014-07-25: qty 50

## 2014-07-25 MED ORDER — LORAZEPAM 2 MG/ML IJ SOLN
0.5000 mg | Freq: Once | INTRAMUSCULAR | Status: AC
  Administered 2014-07-25: 0.5 mg via INTRAVENOUS

## 2014-07-25 MED ORDER — DEXAMETHASONE SODIUM PHOSPHATE 10 MG/ML IJ SOLN
INTRAMUSCULAR | Status: AC
  Administered 2014-07-25: 10 mg via INTRAVENOUS
  Filled 2014-07-25: qty 1

## 2014-07-25 MED ORDER — ONDANSETRON 4 MG PO TBDP
ORAL_TABLET | ORAL | Status: DC
Start: 2014-07-25 — End: 2014-07-26
  Filled 2014-07-25: qty 1

## 2014-07-25 MED ORDER — METOCLOPRAMIDE HCL 5 MG/ML IJ SOLN
10.0000 mg | Freq: Once | INTRAMUSCULAR | Status: AC
  Administered 2014-07-25: 10 mg via INTRAVENOUS

## 2014-07-25 NOTE — ED Notes (Signed)
Yesterday developed headache,, today developed diarrhea and nausea

## 2014-07-25 NOTE — ED Provider Notes (Addendum)
[facility] Emergency Department Provider Note  ____________________________________________  Time seen: 41   I have reviewed the triage vital signs and the nursing notes.  History is currently limited due to the patient's distraught state. The daughter assists with information.  HISTORY  Chief Complaint Headache "migraine"    HPI Theresa Stone is a 49 y.o. female who is a history of migraine headaches. She began having this headache last night. She can feel it coming on, she reports she does get an aura. And then the pain has been fairly severe. She is extremely upset and distressed currently. She does report nausea and vomiting. She is also having difficulty with her left arm and having some difficulty ambulating. She is having difficulty with her speech as she is tearful and sobbing somewhat.  Her daughter assists with history.    Past Medical History  Diagnosis Date  . Hypertension     There are no active problems to display for this patient.   History reviewed. No pertinent past surgical history.  No current outpatient prescriptions on file.  Allergies Review of patient's allergies indicates no known allergies.  No family history on file.  Social History History  Substance Use Topics  . Smoking status: Current Every Day Smoker  . Smokeless tobacco: Not on file  . Alcohol Use: No    Review of Systems Review of systems is a bit limited due to the patient's distraught state. Constitutional: Negative for fever. ENT: Negative for sore throat. Cardiovascular: Negative for chest pain. Respiratory: Negative for shortness of breath. Gastrointestinal: Negative for abdominal pain, vomiting and diarrhea. Genitourinary: Negative for dysuria. Musculoskeletal: No myalgias or injuries. Skin: Negative for rash. Neurological: Notable for severe migraine and some weakness in her left arm. See history of present illness   10-point ROS  otherwise negative.  ____________________________________________   PHYSICAL EXAM:  VITAL SIGNS: ED Triage Vitals  Enc Vitals Group     BP 07/25/14 1810 179/99 mmHg     Pulse Rate 07/25/14 1810 102     Resp 07/25/14 1810 24     Temp 07/25/14 1810 98.9 F (37.2 C)     Temp Source 07/25/14 1810 Oral     SpO2 07/25/14 1810 98 %     Weight 07/25/14 1810 176 lb (79.833 kg)     Height 07/25/14 1810  (1.702 m)     Head Cir --      Peak Flow --      Pain Score 07/25/14 1808 10     Pain Loc --      Pain Edu? --      Excl. in GC? --     Constitutional: Patient is distraught, tearful, sobbing, and breathing in a short stuttering manner. She takes a little bit of time to answer questions, and due to the crying, is a little bit difficult to understand at times.  Cardiovascular: Normal rate, regular rhythm, no murmur noted Respiratory:  Normal respiratory effort, no tachypnea.    Breath sounds are clear and equal bilaterally.  Gastrointestinal: Soft and nontender. No distention.  Back: No muscle spasm, no tenderness, no CVA tenderness. Musculoskeletal: No deformity noted.  No noted edema. Neurologic: Limited neurologic exam currently. Sobbing, with stuttering speech. Skin:  Skin is warm, dry. No rash noted. Psychiatric: Patient is having some difficulty interacting communicating with others due to her severe pain. She is distraught. ____________________________________________  ED ECG REPORT I, Eston Heslin W, the attending physician, personally viewed and interpreted this ECG.  Date: 07/25/2014  EKG Time: 1806  Rate: 90  Rhythm: Normal sinus rhythm  Axis: Normal  Intervals: Normal  ST&T Change: None noted   ____________________________________________   INITIAL IMPRESSION / ASSESSMENT AND PLAN / ED COURSE  Pertinent labs & imaging results that were available during my care of the patient were reviewed by me and considered in my medical decision making (see chart for  details).  The patient reports she has had similar headaches before. The last one was approximately one year ago. I suspect this patient is having a complex migraine. We've discussed getting a CT versus treatment for migraine. The patient and daughter agree that first we need to attempt to get the pain under control. With pain under control, hoping to have a better exam than what am currently able to perform.  ----------------------------------------- 8:42 PM on 07/25/2014 -----------------------------------------  Reexamination: Patient is significantly better. She is able to communicate in a normal manner. Her thought process is intact. She is able to follow commands. At this time unable to perform a more thorough neurologic exam. She has equal grip strength, 5 over 5 strength in all 4 extremities, negative pronator drift, and good finger to nose coordination.   ----------------------------------------- 9:53 PM on 07/25/2014 -----------------------------------------  Patient now reports she is ready to go home. She feels much more comfortable. As noted above at 8:42, she is much more communicative and she has not had any acute distress. Neurologically she is intact.    ____________________________________________   FINAL CLINICAL IMPRESSION(S) / ED DIAGNOSES  Final diagnoses:  Intractable migraine with aura with status migrainosus   - now resolved after treatment    Darien Ramusavid W Lynisha Osuch, MD 07/25/14 2157  Darien Ramusavid W Idonna Heeren, MD 07/25/14 2158

## 2014-07-25 NOTE — Discharge Instructions (Signed)
You appear to have had a complex migraine this evening. Your symptoms have eased significantly in any neurologic changes have resolved. You were treated with Reglan, magnesium, and Decadron. [facility]. He may need to neurologist for ongoing severe headaches if these recur. Return to the emergency department if you again had severe headache, any focal weakness, or any other urgent concerns.  Migraine Headache A migraine headache is an intense, throbbing pain on one or both sides of your head. A migraine can last for 30 minutes to several hours. CAUSES  The exact cause of a migraine headache is not always known. However, a migraine may be caused when nerves in the brain become irritated and release chemicals that cause inflammation. This causes pain. Certain things may also trigger migraines, such as:  Alcohol.  Smoking.  Stress.  Menstruation.  Aged cheeses.  Foods or drinks that contain nitrates, glutamate, aspartame, or tyramine.  Lack of sleep.  Chocolate.  Caffeine.  Hunger.  Physical exertion.  Fatigue.  Medicines used to treat chest pain (nitroglycerine), birth control pills, estrogen, and some blood pressure medicines. SIGNS AND SYMPTOMS  Pain on one or both sides of your head.  Pulsating or throbbing pain.  Severe pain that prevents daily activities.  Pain that is aggravated by any physical activity.  Nausea, vomiting, or both.  Dizziness.  Pain with exposure to bright lights, loud noises, or activity.  General sensitivity to bright lights, loud noises, or smells. Before you get a migraine, you may get warning signs that a migraine is coming (aura). An aura may include:  Seeing flashing lights.  Seeing bright spots, halos, or zigzag lines.  Having tunnel vision or blurred vision.  Having feelings of numbness or tingling.  Having trouble talking.  Having muscle weakness. DIAGNOSIS  A migraine  headache is often diagnosed based on:  Symptoms.  Physical exam.  A CT scan or MRI of your head. These imaging tests cannot diagnose migraines, but they can help rule out other causes of headaches. TREATMENT Medicines may be given for pain and nausea. Medicines can also be given to help prevent recurrent migraines.  HOME CARE INSTRUCTIONS  Only take over-the-counter or prescription medicines for pain or discomfort as directed by your health care provider. The use of long-term narcotics is not recommended.  Lie down in a dark, quiet room when you have a migraine.  Keep a journal to find out what may trigger your migraine headaches. For example, write down:  What you eat and drink.  How much sleep you get.  Any change to your diet or medicines.  Limit alcohol consumption.  Quit smoking if you smoke.  Get 7-9 hours of sleep, or as recommended by your health care provider.  Limit stress.  Keep lights dim if bright lights bother you and make your migraines worse. SEEK IMMEDIATE MEDICAL CARE IF:   Your migraine becomes severe.  You have a fever.  You have a stiff neck.  You have vision loss.  You have muscular weakness or loss of muscle control.  You start losing your balance or have trouble walking.  You feel faint or pass out.  You have severe symptoms that are different from your first symptoms. MAKE SURE YOU:   Understand these instructions.  Will watch your condition.  Will get help right away if you are not doing well or get worse. Document Released: 12/30/2004 Document Revised: 05/16/2013 Document Reviewed: 09/06/2012 [facility] Patient Information 2015 [employer]. This information is not intended to replace advice given to you by your health care provider. Make sure you discuss any questions you have with your health care provider.

## 2014-07-25 NOTE — ED Notes (Signed)
Patient complaining of difficulty breathing.  Upon assessment she has labored breathing, diaphoretic, and c/o severe neck pain.  Instructed Patient to slow her breathing.  Once this was occurred patient began calming down.  VS stable.

## 2014-07-25 NOTE — ED Notes (Signed)
Patient has a history of migraines and believes this is one as well.  Explains that she does get an upset stomach every once in a while when she has a migraine. Patient was in fetal position and sobbing when assessed.

## 2014-12-27 ENCOUNTER — Emergency Department: Payer: BC Managed Care – PPO

## 2014-12-27 ENCOUNTER — Ambulatory Visit (INDEPENDENT_AMBULATORY_CARE_PROVIDER_SITE_OTHER): Payer: BC Managed Care – PPO | Admitting: Family Medicine

## 2014-12-27 ENCOUNTER — Emergency Department
Admission: EM | Admit: 2014-12-27 | Discharge: 2014-12-27 | Disposition: A | Discharge status: Home / Self Care | Payer: BC Managed Care – PPO | Attending: Emergency Medicine | Admitting: Emergency Medicine

## 2014-12-27 ENCOUNTER — Encounter (INDEPENDENT_AMBULATORY_CARE_PROVIDER_SITE_OTHER): Payer: Self-pay

## 2014-12-27 VITALS — BP 184/85 | HR 74 | Temp 97.9°F | Resp 17 | Ht 67.0 in | Wt 182.0 lb

## 2014-12-27 DIAGNOSIS — R110 Nausea: Secondary | ICD-10-CM

## 2014-12-27 DIAGNOSIS — I10 Essential (primary) hypertension: Secondary | ICD-10-CM

## 2014-12-27 DIAGNOSIS — R519 Headache, unspecified: Secondary | ICD-10-CM

## 2014-12-27 DIAGNOSIS — J029 Acute pharyngitis, unspecified: Secondary | ICD-10-CM

## 2014-12-27 DIAGNOSIS — R509 Fever, unspecified: Secondary | ICD-10-CM

## 2014-12-27 DIAGNOSIS — G4489 Other headache syndrome: Secondary | ICD-10-CM | POA: Insufficient documentation

## 2014-12-27 DIAGNOSIS — R51 Headache: Secondary | ICD-10-CM

## 2014-12-27 LAB — POCT RAPID STREP A: Rapid Strep A Screen POCT: NEGATIVE

## 2014-12-27 LAB — VH I-STAT CHEM 8 NOTIFICATION

## 2014-12-27 LAB — I-STAT CHEM 8 CARTRIDGE
Anion Gap I-Stat: 19 — ABNORMAL HIGH (ref 7–16)
BUN I-Stat: 10 mg/dL (ref 6–20)
Calcium Ionized I-Stat: 4.3 mg/dL — ABNORMAL LOW (ref 4.35–5.10)
Chloride I-Stat: 102 mMol/L (ref 98–112)
Creatinine I-Stat: 0.6 mg/dL (ref 0.60–1.10)
EGFR: >60 mL/min/{1.73_m2}
Glucose I-Stat: 92 mg/dL (ref 70–99)
Hematocrit I-Stat: 47 % (ref 36.0–48.0)
Hemoglobin I-Stat: 16 gm/dL (ref 12.0–16.0)
Potassium I-Stat: 4.4 mMol/L (ref 3.5–5.3)
Sodium I-Stat: 138 mMol/L (ref 135–145)
TCO2 I-Stat: 23 mMol/L — ABNORMAL LOW (ref 24–29)

## 2014-12-27 LAB — CBC AND DIFFERENTIAL
Basophils %: 0.7 % (ref 0.0–3.0)
Basophils Absolute: 0 10*3/uL (ref 0.0–0.3)
Eosinophils %: 2.1 % (ref 0.0–7.0)
Eosinophils Absolute: 0.1 10*3/uL (ref 0.0–0.8)
Hematocrit: 45.2 % (ref 36.0–48.0)
Hemoglobin: 15 gm/dL (ref 12.0–16.0)
Lymphocytes Absolute: 1.5 10*3/uL (ref 0.6–5.1)
Lymphocytes: 23.5 % (ref 15.0–46.0)
MCH: 29 pg (ref 28–35)
MCHC: 33 gm/dL (ref 32–36)
MCV: 87 fL (ref 80–100)
MPV: 8.7 fL (ref 6.0–10.0)
Monocytes Absolute: 0.8 10*3/uL (ref 0.1–1.7)
Monocytes: 12.5 % (ref 3.0–15.0)
Neutrophils %: 61.2 % (ref 42.0–78.0)
Neutrophils Absolute: 3.9 10*3/uL (ref 1.7–8.6)
PLT CT: 339 10*3/uL (ref 130–440)
RBC: 5.2 10*6/uL — ABNORMAL HIGH (ref 3.80–5.00)
RDW: 13.9 % (ref 11.0–14.0)
WBC: 6.4 10*3/uL (ref 4.0–11.0)

## 2014-12-27 LAB — POCT INFLUENZA A/B
POCT Rapid Influenza A AG: NEGATIVE
POCT Rapid Influenza B AG: NEGATIVE

## 2014-12-27 LAB — SEDIMENTATION RATE: Sed Rate: 16 mm/hr (ref 0–20)

## 2014-12-27 LAB — C-REACTIVE PROTEIN: C-Reactive Protein: 0.26 mg/dL (ref 0.02–0.80)

## 2014-12-27 MED ORDER — DEXAMETHASONE SOD PHOSPHATE PF 10 MG/ML IJ SOLN
10.0000 mg | Freq: Once | INTRAMUSCULAR | Status: AC
Start: 2014-12-27 — End: 2014-12-27
  Administered 2014-12-27: 10 mg via INTRAVENOUS

## 2014-12-27 MED ORDER — KETOROLAC TROMETHAMINE 30 MG/ML IJ SOLN
INTRAMUSCULAR | Status: AC
Start: 2014-12-27 — End: ?
  Filled 2014-12-27: qty 1

## 2014-12-27 MED ORDER — DEXAMETHASONE SOD PHOSPHATE PF 10 MG/ML IJ SOLN
INTRAMUSCULAR | Status: AC
Start: 2014-12-27 — End: ?
  Filled 2014-12-27: qty 1

## 2014-12-27 MED ORDER — KETOROLAC TROMETHAMINE 30 MG/ML IJ SOLN
30.0000 mg | Freq: Once | INTRAMUSCULAR | Status: AC
Start: 2014-12-27 — End: 2014-12-27
  Administered 2014-12-27: 30 mg via INTRAVENOUS

## 2014-12-27 MED ORDER — DIPHENHYDRAMINE HCL 50 MG/ML IJ SOLN
25.0000 mg | Freq: Once | INTRAMUSCULAR | Status: AC
Start: 2014-12-27 — End: 2014-12-27
  Administered 2014-12-27: 25 mg via INTRAVENOUS

## 2014-12-27 MED ORDER — DIPHENHYDRAMINE HCL 50 MG/ML IJ SOLN
INTRAMUSCULAR | Status: AC
Start: 2014-12-27 — End: ?
  Filled 2014-12-27: qty 1

## 2014-12-27 MED ORDER — SODIUM CHLORIDE 0.9 % IV BOLUS
1000.0000 mL | Freq: Once | INTRAVENOUS | Status: AC
Start: 2014-12-27 — End: 2014-12-27
  Administered 2014-12-27: 1000 mL via INTRAVENOUS

## 2014-12-27 MED ORDER — METOCLOPRAMIDE HCL 5 MG/ML IJ SOLN
INTRAMUSCULAR | Status: AC
Start: 2014-12-27 — End: ?
  Filled 2014-12-27: qty 2

## 2014-12-27 MED ORDER — ONDANSETRON 4 MG PO TBDP
4.0000 mg | ORAL_TABLET | Freq: Once | ORAL | Status: AC
Start: 2014-12-27 — End: 2014-12-27
  Administered 2014-12-27: 4 mg via ORAL

## 2014-12-27 MED ORDER — METOCLOPRAMIDE HCL 5 MG/ML IJ SOLN
10.0000 mg | Freq: Once | INTRAMUSCULAR | Status: AC
Start: 2014-12-27 — End: 2014-12-27
  Administered 2014-12-27: 10 mg via INTRAVENOUS

## 2014-12-27 NOTE — Discharge Instructions (Signed)
Self-Care for Headaches  Most headaches aren't serious and can be relieved with self-care. But some headaches may be a sign of another health problem like eye trouble or high blood pressure. To find the best treatment, learn what kind of headaches you get. For tension headaches, self-care will usually help. To treat migraines, ask your doctor for advice. It is also possible to get both tension and migraine headaches. Self-care involves relieving the pain and avoiding headache "triggers" if you can.    Ways to Reduce Pain and Tension  Try these steps:   Apply a cold compress or ice pack to the pain site.   Drink fluids. If nausea makes it hard to drink, try sucking on ice.   Rest. Protect yourself from bright light and loud noises.   Calm your emotions by imagining a peaceful scene.   Massage tight neck, shoulder, and head muscles.   To relax muscles, soak in a hot bath or use a hot shower.  Use Medications  Aspirin or aspirin substitutes, such as ibuprofen and acetaminophen, can relieve headache. Remember: Never give aspirin to anyone 18 years old or younger.Use pain medications only when necessary.  Track Your Headaches  Keeping a headache diary can help you and your doctor identify what's causing your headaches:   Note when each headache occurs.   Identify your activities and the foods you've eaten6 to 8hours before the headache began.   Look for any trends or "triggers."  Signs of Tension Headache  Any of the following can be signs:   Dull pain or feeling of pressure in a tight band around your head   Pain in your neck or shoulders   Headache without a definite beginning or end   Headache after an activity such as driving or working on a computer  Signs of Migraine  Any of the following can be signs:   Throbbing pain on one or both sides of your head   Nausea or vomiting   Extreme sensitivity to light, sound, and smells   Bright spots, flashes, or other visual changes   Pain or nausea so  severe that you can't continue your daily activities     2000-2015 [employer] [address], [place] [address]. All rights reserved. This information is not intended as a substitute for professional medical care. Always follow your healthcare professional's instructions.

## 2014-12-27 NOTE — ED Notes (Signed)
Bed: RAU-G  Expected date:   Expected time:   Means of arrival:   Comments:  EMS

## 2014-12-27 NOTE — Progress Notes (Signed)
Subjective:    Patient ID: Marie [name] is a 49 y.o. female.    HPI : seen in the [facility] for nasal and chest congestion, with hacking cough, also reports hurting in her neck back and chest, this has been ongoing x 3 days. On arrival noted to be having BP 160/10, reported worsening HA, with dizziness and nausea, reports she is having the" worst HA" Has H/o HTN, MIgraine, reports that she had taken her antihypertensive medications and had not taken any antitussive medications. + generalized weakness.  The following portions of the patient's history were reviewed and updated as appropriate: allergies, current medications, past family history, past medical history, past social history, past surgical history and problem list.    Review of Systems   HENT: Positive for congestion and sinus pressure.    Respiratory: Positive for cough and chest tightness.    Gastrointestinal: Positive for nausea.   Musculoskeletal: Positive for back pain and neck pain.   Neurological: Positive for dizziness and headaches.   All other systems reviewed and are negative.        Objective:    BP 184/85 mmHg  Pulse 74  Temp(Src) 97.9 F (36.6 C)  Resp 17  Ht 1.702 m (5\' 7" )  Wt 82.555 kg (182 lb)  BMI 28.50 kg/m2  SpO2 100%  LMP 12/06/2014    Physical Exam   Constitutional: She appears well-developed and well-nourished. She appears distressed.   HENT:   Head: Normocephalic and atraumatic.   Eyes: Conjunctivae and EOM are normal. Pupils are equal, round, and reactive to light.   Cardiovascular: Normal rate, regular rhythm and normal heart sounds.    Pulmonary/Chest: Effort normal and breath sounds normal. She has no wheezes. She has no rales.   Musculoskeletal: Normal range of motion.   Lymphadenopathy:     She has no cervical adenopathy.   Neurological: She is alert.   Appears a little bit confused.  Patient not able to do rest of exam, is very distressed.   Skin: Skin is warm. No rash noted. She is diaphoretic.   Psychiatric: She  has a normal mood and affect.   Vitals reviewed.        Assessment and Plan:       Lab Results from today's visit:  Recent Results (from the past 4 hour(s))   POCT RAPID STREP A    Collection Time: 12/27/14  3:44 PM   Result Value Ref Range    POCT QC Pass     Rapid Strep A Screen POCT Negative  Negative    Comment       Negative Results should be confirmed by throat Cx to confirm absence of Strep A inf.   POCT INFLUENZA A/B    Collection Time: 12/27/14  3:44 PM   Result Value Ref Range    POCT QC Pass     POCT Rapid Influenza A AG Negative Negative    POCT Rapid Influenza B AG Negative Negative       Radiology Results from today's visit:  No results found.    Assessment/Plan for today's visit:  1. Pharyngitis, unspecified etiology  POCT RAPID STREP A   2. Fever in adult  POCT INFLUENZA A/B   3. Acute intractable headache, unspecified headache type     4. Essential hypertension     5. Nausea  ondansetron (ZOFRAN-ODT) disintegrating tablet 4 mg   Worsening acute HA, with some level of confusion in the setting of uncontrolled HTN, initial BP  was 160/110.   Concerned for hypertensive urgency, CVA, other DDx considered include Migraine. Recommend further eval and MX in ED. Patient agreed, ED communications notified.     Clide Dales, MD  [facility]  12/27/2014 4:18 PM        Patient Instructions   Please follow at ED for further eval and Mx.                Clide Dales, MD  [facility]  12/27/2014  4:18 PM

## 2014-12-27 NOTE — ED Provider Notes (Signed)
EMERGENCY DEPARTMENT  PHYSICIAN NOTE    Patient Name: Marie [name],Marie [name]  Encounter Date:  12/27/2014  PCP: Marie See, MD  Patient DOB:  [birth year]  MRN:  [id number]  Room:  N7/N7-[name]    Diagnosis / Disposition     Clinical Impression  1. Other headache syndrome        Disposition  ED Disposition     Discharge Marie [name] discharge to home/self care.    Condition at disposition: Stable           Prescriptions  New Prescriptions    No medications on file       History of Presenting Illness     Chief complaint: Headache and Hypertension    HPI/ROS is limited by: none  HPI/ROS given by: patient, EMS and previous records      Marie [name] is [name] 49 y.o. female who presents after [facility] instructed pt to come to ED for hypertension. Originally went to [facility] at 1400 for cough, nausea, and headache.  BP was 180/110 there, she was given zofran and "something else for blood pressure that dissolved under my tongue." Whilst in [facility], patient was given reglan, decadron, toradol, and benadryl by Dr. Windell Moulding and states she feels fine now and would like to go home. At its worst the headache was moderate in severity. Patient states that she does get headaches periodically. She states this is felt like previous headaches.    Review of Systems   Review of Systems   Constitutional: Negative.  Negative for fever.   HENT: Negative for sore throat.    Eyes: Negative.    Respiratory: Negative for shortness of breath.    Cardiovascular: Negative for chest pain and leg swelling.   Gastrointestinal: Negative.  Negative for nausea, vomiting and abdominal pain.   Genitourinary: Negative for dysuria, urgency and frequency.   Musculoskeletal: Negative.  Negative for falls.   Skin: Negative.  Negative for rash.   Neurological: Negative for loss of consciousness.   Endo/Heme/Allergies: Negative.    Psychiatric/Behavioral: Negative.      Physical Exam   Blood pressure 127/80, pulse 60, temperature 97.7 F (36.5 C), temperature  source Oral, resp. rate 16, height 1.702 m, last menstrual period 12/06/2014, SpO2 100 %.  The vital signs and the nurses note have been reviewed by me  Physical Exam   Constitutional: She is oriented to person, place, and time. She appears well-developed and well-nourished. No distress.   HENT:   Head: Normocephalic and atraumatic.   Eyes: EOM are normal.   Neck: Normal range of motion.   Cardiovascular: Normal rate and normal heart sounds.    Pulmonary/Chest: Effort normal and breath sounds normal. No respiratory distress.   Abdominal: Soft. There is no tenderness.   Musculoskeletal: Normal range of motion. She exhibits no edema.   Neurological: She is alert and oriented to person, place, and time. She has normal strength. No cranial nerve deficit or sensory deficit. She exhibits normal muscle tone. Coordination normal.   Skin: Skin is warm and dry.   Psychiatric: She has [name] normal mood and affect.   Nursing note and vitals reviewed.       Allergies      Review of patient's allergies indicates no known allergies.    The patient's allergies were reviewed by the M.D.    Medications     Current Outpatient Rx   Name  Route  Sig  Dispense  Refill   . metoprolol XL (TOPROL-XL) 50  MG 24 hr tablet    Oral    Take 1 tablet (50 mg total) by mouth daily.    30 tablet    1     . DISCONTD: ondansetron (ZOFRAN ODT) 8 MG disintegrating tablet    Oral    Take 1 tablet (8 mg total) by mouth every 8 (eight) hours as needed for Nausea.    20 tablet    0     . DISCONTD: traMADol (ULTRAM) 50 MG tablet    Oral    Take 1 tablet (50 mg total) by mouth every 8 (eight) hours as needed for Pain.    15 tablet    0          The patient's medications were reviewed by the M.D.  Past Medical History     Past Medical History   Diagnosis Date   . Hypertension    . Anxiety    . Migraine        The patient's past medical history was reviewed by the M.D.  Past Surgical History     No past surgical history on file.    The patient's past surgical  history was reviewed by the M.D.    Family History     No family history on file.    Social History     Social History   Substance Use Topics   . Smoking status: Former Games developer   . Smokeless tobacco: Not on file   . Alcohol Use: Yes      Comment: occasional       Orders Placed & Medications Given     Orders Placed This Encounter   Procedures   . CT Head WO Contrast   . CBC and differential   . C Reactive Protein   . Sedimentation rate (ESR)   . I-Stat Chem 8 Notification   . i-Stat Chem 8 CartrIDge       Medications   diphenhydrAMINE (BENADRYL) injection 25 mg (25 mg Intravenous Given 12/27/14 1741)   ketorolac (TORADOL) injection 30 mg (30 mg Intravenous Given 12/27/14 1741)   dexamethasone (DECADRON) injection 10 mg (10 mg Intravenous Given 12/27/14 1741)   metoclopramide (REGLAN) injection 10 mg (10 mg Intravenous Given 12/27/14 1742)   sodium chloride 0.9 % bolus 1,000 mL (1,000 mLs Intravenous New Bag 12/27/14 1745)       Diagnostic Results       The results of the diagnostic studies below have been reviewed by myself:    Labs  Results     Procedure Component Value Units Date/Time    Sedimentation rate (ESR) [[id number]] Collected:  12/27/14 1754    Specimen Information:  Blood Updated:  12/27/14 1819     Sed Rate 16 mm/hr     C Reactive Protein [[id number]] Collected:  12/27/14 1754    Specimen Information:  Plasma Updated:  12/27/14 1815     C-Reactive Protein 0.26 mg/dL     CBC and differential [[id number]]  (Abnormal) Collected:  12/27/14 1754    Specimen Information:  Blood from Blood Updated:  12/27/14 1809     WBC 6.4 K/cmm      RBC 5.20 (H) M/cmm      Hemoglobin 15.0 gm/dL      Hematocrit 08.6 %      MCV 87 fL      MCH 29 pg      MCHC 33 gm/dL      RDW 57.8 %  PLT CT 339 K/cmm      MPV 8.7 fL      NEUTROPHIL % 61.2 %      Lymphocytes 23.5 %      Monocytes 12.5 %      Eosinophils % 2.1 %      Basophils % 0.7 %      Neutrophils Absolute 3.9 K/cmm      Lymphocytes Absolute 1.5 K/cmm      Monocytes Absolute  0.8 K/cmm      Eosinophils Absolute 0.1 K/cmm      BASO Absolute 0.0 K/cmm     i-Stat Chem 8 CartrIDge [[id number]]  (Abnormal) Collected:  12/27/14 1741    Specimen Information:  Blood Updated:  12/27/14 1744     i-STAT Sodium 138 mMol/L      i-STAT Potassium 4.4 mMol/L      i-STAT Chloride 102 mMol/L      TCO2, ISTAT 23 (L) mMol/L      Ionized Ca, ISTAT 4.30 (L) mg/dL      i-STAT Glucose 92 mg/dL      i-STAT Creatinine 0.60 mg/dL      i-STAT BUN 10 mg/dL      Anion Gap, ISTAT 91.4 (H)      EGFR >60 mL/min/1.15m2      i-STAT Hematocrit 47.0 %      i-STAT Hemoglobin 16.0 gm/dL     I-Stat Chem 8 Notification [[id number]] Collected:  12/27/14 1706    Specimen Information:  ISTAT Updated:  12/27/14 1740     I-STAT Notification Istat Notification             Radiologic Studies  I have personally reviewed the images myself  No results found.      MDM / ED COURSE     Blood pressure 127/80, pulse 60, temperature 97.7 F (36.5 C), temperature source Oral, resp. rate 16, height 1.702 m, last menstrual period 12/06/2014, SpO2 100 %.    I have personally reviewed the patient's past medical records.   The differential diagnosis includes, but is not limited to meningitis, intracranial hemorrhage, subarachnoid hemorrhage, epidural hematoma, sinusitis, viral syndrome, temporal arteritis, intracranial mass, normal pressure hydrocephalus, pseudotumor cerebri,  tension headache, migraine headache, cluster headache, herpes zoster, acute angle closure glaucoma, carbon monoxide poisoning    This patient presents to the Emergency Department with [name] headache.  Based on my history and examination, several differential diagnoses including cluster headache, tension headache, sinusitis, and migraine have been considered.  Serious or potentially life-threatening causes of the patient's symptoms like meningitis or causes of brain swelling seem unlikely. The patient seems improved, is neurologically intact, and can be discharged home and managed  with symptomatic care.  I advised the patient to return to the emergency department immediately should they develop worsening pain, fever, or any acute concerns .  Diagnostic impression and plan were discussed with the patient and/or family.  If ordered, results of lab/radiology tests were reviewed and discussed with the patient and/or family. Questions were answered and concerns were addressed.  The patient was encouraged to follow-up with their primary care provider or specialist if not improved.      Diagnosis / Disposition     Clinical Impression  1. Other headache syndrome        Disposition  ED Disposition     Discharge Marie [name] discharge to home/self care.    Condition at disposition: Stable            Prescriptions  New Prescriptions    No medications on file       Note:  This chart was generated by the Epic EMR system/ speech recognition and may contain inherent errors, including typographical, or omissions not intended by the user        Carlos American was my scribe today and assisted me with documentation only. She was present during the questioning and physical examination of this patient and documented what she observed and was instructed to document. This note accurately reflects work and decisions made by me. Ermalene Postin M.D.    Ermalene Postin, MD  12/30/14 [phone]

## 2014-12-27 NOTE — Patient Instructions (Signed)
Please follow at ED for further eval and Mx.

## 2014-12-27 NOTE — ED Notes (Signed)
Pt seen at urgent care today for headache and cough.  PT told to come to er for hypertension.

## 2015-02-15 IMAGING — CT CT HEAD WITHOUT CONTRAST
1 series · 16 of 30 positions shown, 20 images · non-contrast
Comparison: 10/12/2012

CLINICAL DATA: Altered level of consciousness, hypertension,
positive neurologic deficits LEFT arm with LEFT facial droop

EXAM:
CT HEAD WITHOUT CONTRAST
TECHNIQUE: Contiguous axial images were obtained from the base of the skull
through the vertex without intravenous contrast.

[Series 2: head wo · axial · 0.42mm/px · z∈[-151,-7]mm · 16 of 36 slices shown, 20 images]
[im 2/36  brain]
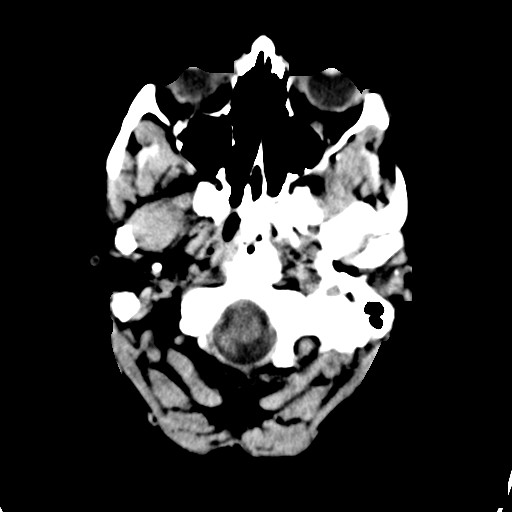
[im 2/36  bone]
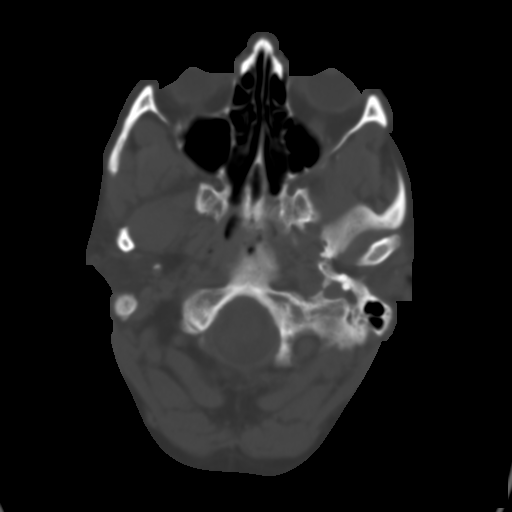
[im 4/36  brain]
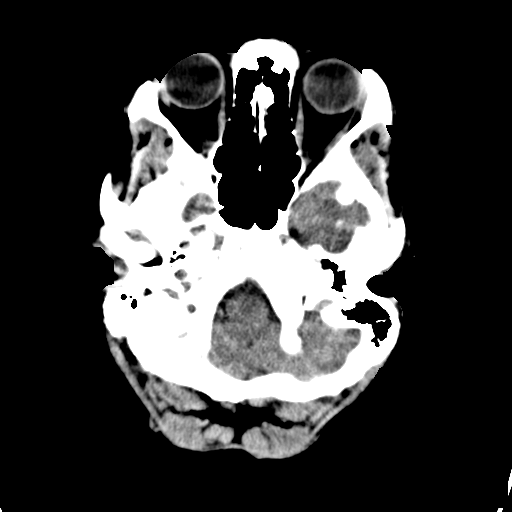
[im 7/36  brain]
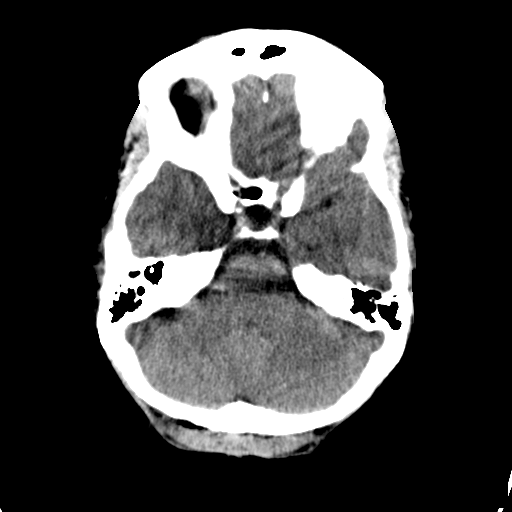
[im 9/36  brain]
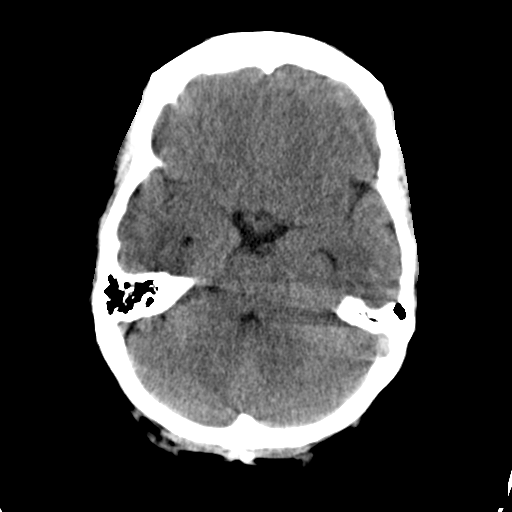
[im 10/36  brain]
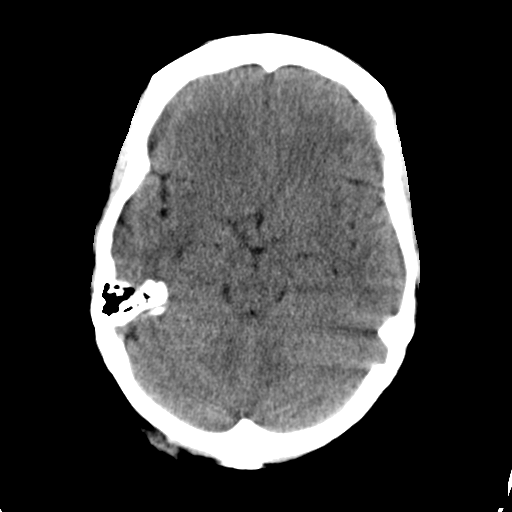
[im 10/36  bone]
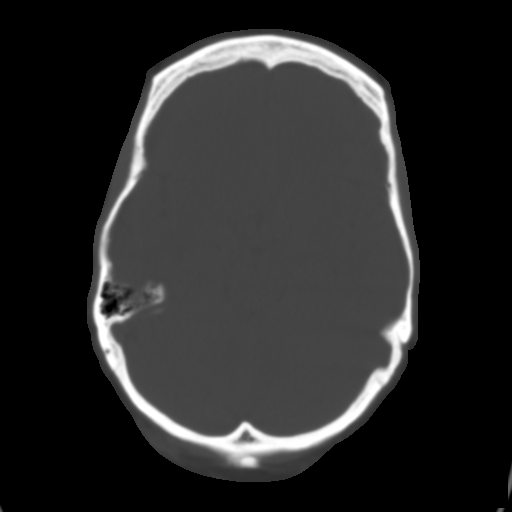
[im 13/36  brain]
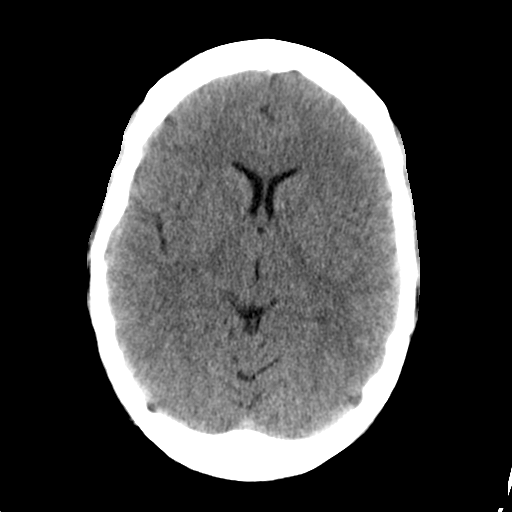
[im 15/36  brain]
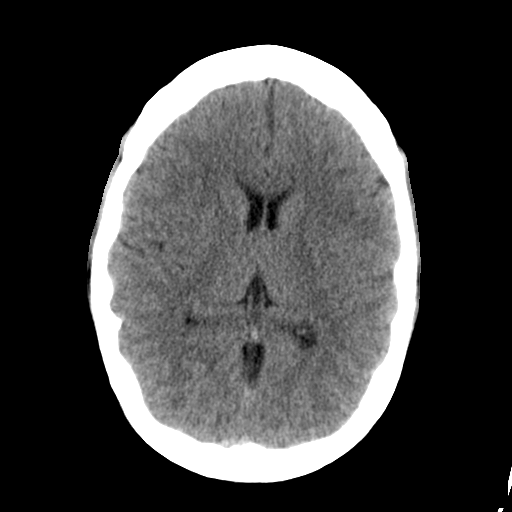
[im 17/36  brain]
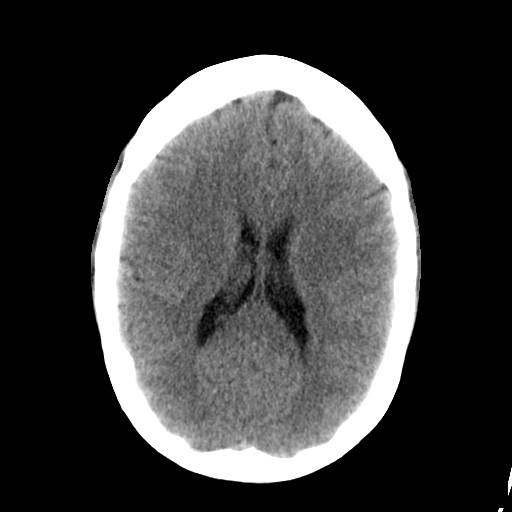
[im 19/36  brain]
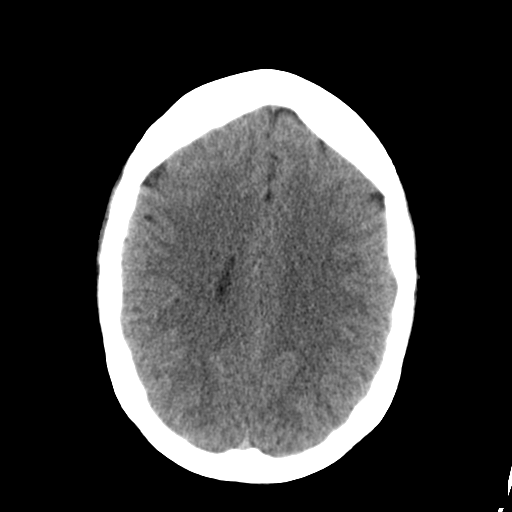
[im 19/36  bone]
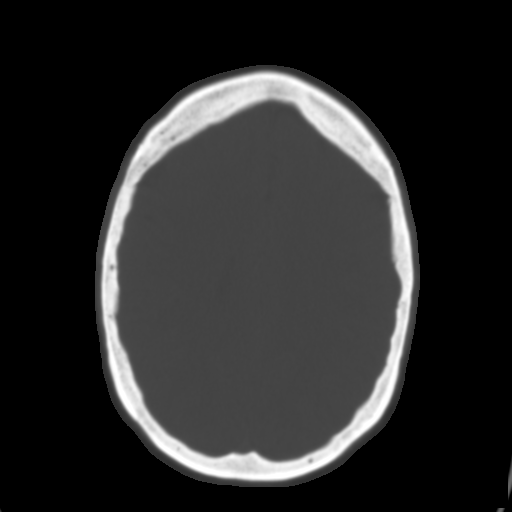
[im 21/36  brain]
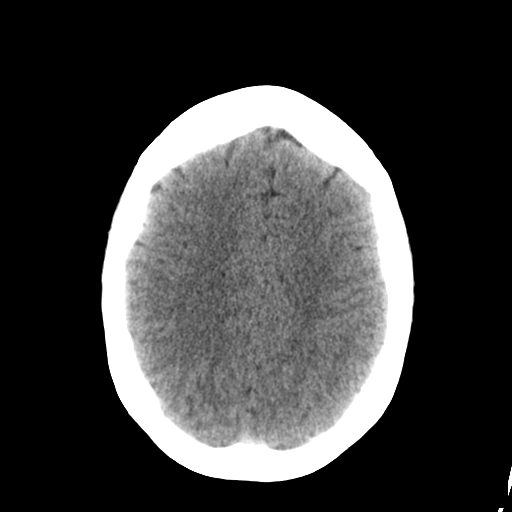
[im 23/36  brain]
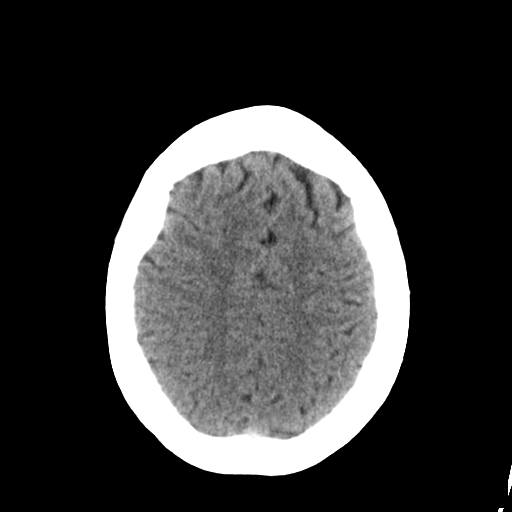
[im 26/36  brain]
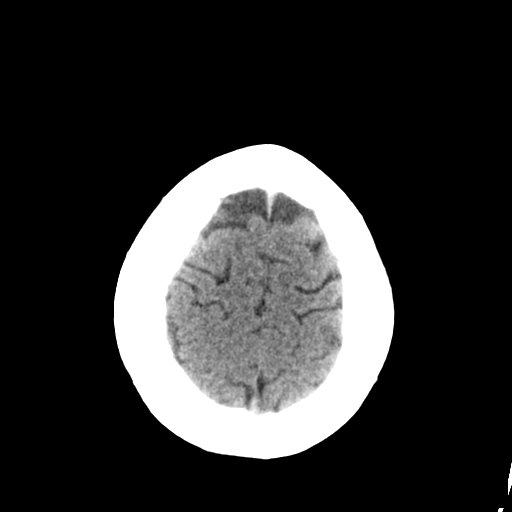
[im 27/36  brain]
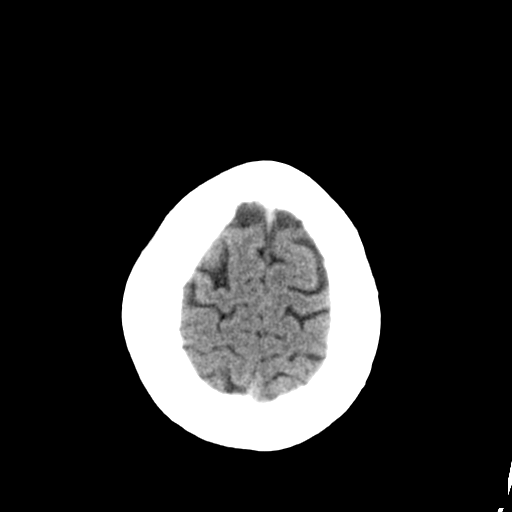
[im 27/36  bone]
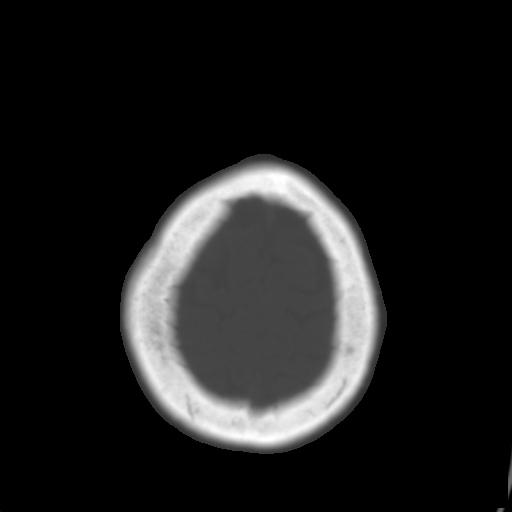
[im 29/36  brain]
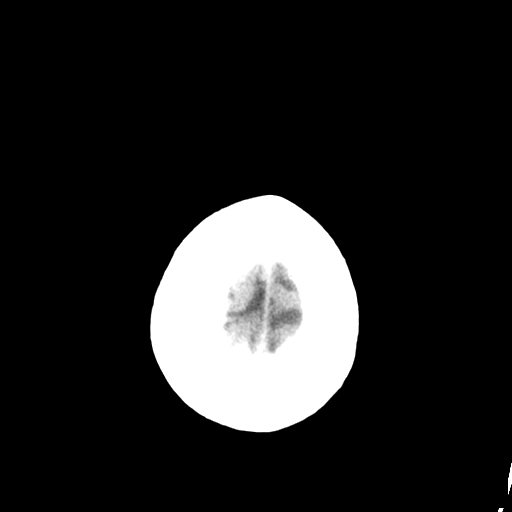
[im 32/36  brain]
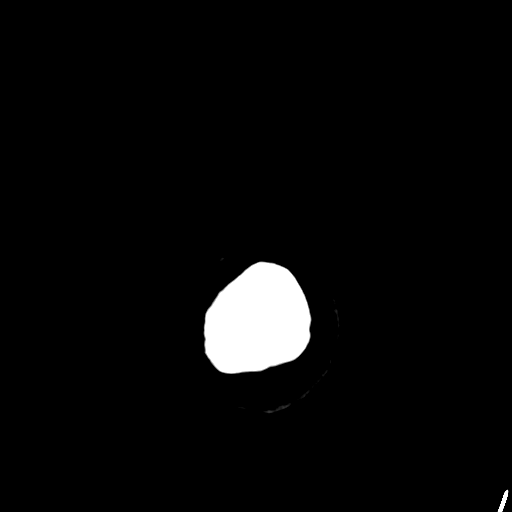
[im 34/36  brain]
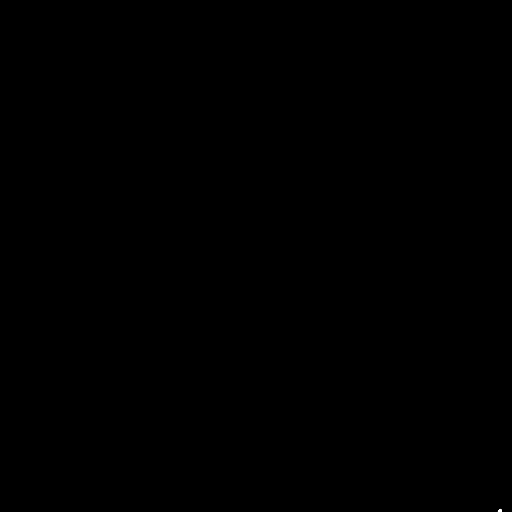

[16 of 30 positions shown; findings below may reference images not displayed]

FINDINGS: Streak artifacts at skull base.

Normal ventricular morphology.

No midline shift or mass effect.

Normal appearance of brain parenchyma.

No intracranial hemorrhage, mass lesion or evidence acute
infarction.

No extra-axial fluid collections.

Bones and sinuses unremarkable.
IMPRESSION: No acute intracranial abnormalities.

## 2015-11-13 ENCOUNTER — Emergency Department: Payer: BC Managed Care – PPO

## 2015-11-13 ENCOUNTER — Emergency Department
Admission: EM | Admit: 2015-11-13 | Discharge: 2015-11-13 | Disposition: A | Discharge status: Home / Self Care | Payer: BC Managed Care – PPO | Attending: Emergency Medicine | Admitting: Emergency Medicine

## 2015-11-13 DIAGNOSIS — G43829 Menstrual migraine, not intractable, without status migrainosus: Secondary | ICD-10-CM | POA: Insufficient documentation

## 2015-11-13 LAB — ECG 12-LEAD
P Wave Axis: 58 deg
P-R Interval: 168 ms
Patient Age: 49 years
Q-T Interval(Corrected): 444 ms
Q-T Interval: 417 ms
QRS Axis: 25 deg
QRS Duration: 85 ms
T Axis: 18 years
Ventricular Rate: 68 //min

## 2015-11-13 MED ORDER — DIPHENHYDRAMINE HCL 50 MG/ML IJ SOLN
25.0000 mg | Freq: Once | INTRAMUSCULAR | Status: AC
Start: 2015-11-13 — End: 2015-11-13
  Administered 2015-11-13: 25 mg via INTRAVENOUS

## 2015-11-13 MED ORDER — PROCHLORPERAZINE EDISYLATE 5 MG/ML IJ SOLN
10.0000 mg | Freq: Once | INTRAMUSCULAR | Status: AC
Start: 2015-11-13 — End: 2015-11-13
  Administered 2015-11-13: 10 mg via INTRAVENOUS

## 2015-11-13 MED ORDER — DIPHENHYDRAMINE HCL 50 MG/ML IJ SOLN
INTRAMUSCULAR | Status: AC
Start: 2015-11-13 — End: ?
  Filled 2015-11-13: qty 1

## 2015-11-13 MED ORDER — BUTALBITAL-APAP-CAFFEINE 50-325-40 MG PO TABS
1.0000 | ORAL_TABLET | ORAL | 0 refills | Status: AC | PRN
Start: 2015-11-13 — End: ?

## 2015-11-13 MED ORDER — PROCHLORPERAZINE EDISYLATE 5 MG/ML IJ SOLN
INTRAMUSCULAR | Status: AC
Start: 2015-11-13 — End: ?
  Filled 2015-11-13: qty 2

## 2015-11-13 MED ORDER — ONDANSETRON HCL 4 MG/2ML IJ SOLN
INTRAMUSCULAR | Status: AC
Start: 2015-11-13 — End: ?
  Filled 2015-11-13: qty 2

## 2015-11-13 NOTE — ED Notes (Signed)
Bed: NR1-A  Expected date:   Expected time:   Means of arrival:   Comments:  EMS

## 2015-11-13 NOTE — ED Provider Notes (Signed)
[facility]  EMERGENCY DEPARTMENT  History and Physical Exam       Patient Name: Marie, Fields  Encounter Date:  11/13/2015  Attending Physician: Myna Hidalgo, MD  PCP: Christa See, MD  Patient DOB:  12/21/1965  MRN:  [id number]  Room:  NR1/NR1-A      History of Presenting Illness     Chief complaint: Hypertension and Headache      HPI/ROS is limited by: none  HPI/ROS given by: Patient    Marie Fields is a 50 y.o. female who presents with headache for 3 hour(s). Symptoms have been continuous. Pain is reported to be a 8/10 at this time. Symptoms have been worsened by light exposure and relieved by nothing. The quality is described as sharp.  There was not radiation.  Associated symptoms are weight gain, congestion, photophobia, abdominal pain, nausea, back pain, neck pain, numbness, and sleep disturbance.    Pt states that she was cleaning when the headache started and that it came on suddenly. Pt states that this is the worst headache she has ever had. Pt states that she takes metoprolol. Pt states that she just moved to the area from [place] and does not have a PCP here yet. Pt states that her brother has had a stroke. Pt states that she is currently on her period. Pt states that her last headache was about 1 year ago.     Review of Systems     Review of Systems   Constitutional: Positive for unexpected weight change. Negative for appetite change.   HENT: Positive for congestion. Negative for dental problem.    Eyes: Positive for photophobia.   Gastrointestinal: Positive for abdominal pain and nausea. Negative for blood in stool and vomiting.   Musculoskeletal: Positive for back pain and neck pain.   Neurological: Positive for numbness (face, hands, feet) and headaches.   Psychiatric/Behavioral: Positive for sleep disturbance. Negative for self-injury.   All other systems reviewed and are negative.       Allergies & Medications     Pt has No Known  Allergies.    Home Meds: EMR link not correct; nurses' notes reviewed for meds and dosages     Past Medical History     Pt has a past medical history of Anxiety; Hypertension; and Migraine.     Past Surgical History     Pt  has no past surgical history on file.     Family History     The family history is not on file.     Social History     Pt reports that she has quit smoking. She does not have any smokeless tobacco history on file. She reports that she drinks alcohol. She reports that she does not use drugs.     Physical Exam     Blood pressure 129/59, pulse 62, temperature 97.6 F (36.4 C), temperature source Oral, resp. rate 19, SpO2 99 %.    Physical Exam   Constitutional: She is well-developed, well-nourished, and in no distress. No distress.   HENT:   Head: Normocephalic and atraumatic.   Right Ear: External ear normal.   Left Ear: External ear normal.   Mouth/Throat: Oropharynx is clear and moist.   Eyes: Conjunctivae and EOM are normal. Pupils are equal, round, and reactive to light. Right eye exhibits no discharge. Left eye exhibits no discharge. No scleral icterus.   Neck: Neck supple. No JVD present.   Cardiovascular: Normal rate and regular rhythm.  Exam reveals no gallop and no friction rub.    No murmur heard.  Pulmonary/Chest: Effort normal and breath sounds normal. No stridor. No respiratory distress. She has no wheezes. She has no rales. She exhibits no tenderness.   Abdominal: Soft. Bowel sounds are normal. She exhibits no distension and no mass. There is no tenderness. There is no rebound and no guarding.   Musculoskeletal: She exhibits no edema or tenderness.   Lymphadenopathy:     She has no cervical adenopathy.   Neurological: She is alert. She exhibits normal muscle tone.   Skin: Skin is warm. No rash noted. She is not diaphoretic. No pallor.   Psychiatric: Memory and affect normal.   Nursing note and vitals reviewed.       Diagnostic Results     The results of the diagnostic studies below  have been reviewed by myself:    Labs  Labs Reviewed - No data to display    Radiologic Studies  No results found.    ZOX:WRUEA rhythm  Baseline wander in lead(s) I,III,aVL  No previous ECG available for comparison  HR: 68     ED Course & Treatment     Previous records reviewed.  D/dx, workup, anticipated clinical course discussed with patient/family.  Results reviewed with patient/family.  Patient appreciative of care.  All questions answered.  Patient/family comfortable with treatment plan.      Medical Decision Making     The differential diagnosis includes, but is not limited to meningitis, intracranial hemorrhage, subarachnoid hemorrhage, epidural hematoma, sinusitis, viral syndrome, temporal arteritis, intracranial mass, normal pressure hydrocephalus, pseudotumor cerebri,  tension headache, migraine headache, cluster headache, herpes zoster, acute angle closure glaucoma, carbon monoxide poisoning    This chart was generated by an EMR and may contain errors or omissions not intended by the user.     Procedures / Critical Care     None     Diagnosis / Disposition     Clinical Impression  1. Menstrual migraine without status migrainosus, not intractable        Disposition  ED Disposition     ED Disposition Condition Date/Time Comment    Discharge  Tue Nov 13, 2015  5:27 PM [name] discharge to home/self care.    Condition at disposition: Stable          Follow up for Discharged Patients  [facility]  [address]  [place] [address]  [phone]  In 1 week        Prescriptions for Discharged Patients  Discharge Medication List as of 11/13/2015  5:27 PM      START taking these medications    Details   butalbital-acetaminophen-caffeine (FIORICET, ESGIC) 50-325-40 MG per tablet Take 1-2 tablets by mouth every 4 (four) hours as needed for Pain.for up to 20 doses Not to exceed 6 tablets in 24 hours, Starting Tue 11/13/2015, Print             The documentation recorded by my  scribe, Consepcion Hearing, accurately reflects the services I personally performed and the decisions made by me.  Myna Hidalgo, MD                  Myna Hidalgo, MD  11/13/15 2227

## 2015-11-13 NOTE — Discharge Instructions (Signed)
Migraine Headache  This often severe type of headache is different from other types of headaches in that symptoms other than pain occur with the headache. Nausea and vomiting, lightheadedness, sensitivity to light (photophobia), and other visual disturbances are common migraine symptoms.The pain may last from a few hours to several days. It is not clear why migraines occur but certain factors called "triggers" can raise the risk of having a migraine attack.A migraine may be triggered by emotional stressor depression, or by hormone changes during the menstrual cycle. Other triggers include birth control pills, overuse of migraine medicines, alcohol or caffeine, foods with tyramine (such as aged cheese and wine), eyestrain, weather changes, missed meals,or too little or too much sleep.  Home care  Follow these tips when taking care of yourself at home:   Don't drive yourself home if you were given pain medicine for your headache or are having visual symptoms. Instead, have someone else drive you home. Try to sleep when you get home. You should feel much better when you wake up.   Cold can help ease migraine symptoms. Put an ice pack on your forehead or at the base of your skull. Put heat on the back of your neck to help ease any neck spasm.   Drink only clear liquids or eat a light diet until your symptoms get better. This will help you avoid nausea and vomiting.  How to prevent migraines  Pay attention to what seems to trigger your headache. Try to avoid the triggers when you can. If you have frequent headaches, consider keeping a headache diary. In it, write down what you were doing, feeling, or eating in the hours before each headache. Show this to your healthcare provider to help find the cause of your headaches.  If stress seems to be a trigger for your headaches, figure out what is causing stress in your life. Learn new ways to handle your stress. Ideas include regular exercise, biofeedback,  self-hypnosis, yoga, and meditation. Talk with your healthcare provider to find out more information about managing stress. Many books and digital media are also available on this subject.  Tyramine is a substance found in many foods. It can trigger a migraine in some people. These foods contain tyramine:   Chocolate   Yogurt   All cheeses, but especially aged cheeses   Smoked or pickled fish and meat, including herring, caviar, bologna, pepperoni, and salami   Liver   Avocados   Bananas   Figs   Raisins   Red wine  Try staying away from these foods for 1 to 2 months to see if you have fewer headaches.  How to treat future headaches   Take time out at the first sign of a headache, if possible. Find a quiet, dark, comfortable place to sit or lie down. Let yourself relax or sleep.   Put an ice pack on your forehead or on the area of greatest pain. A heating pad and massage may help if you are having a muscle spasm and tightness in your neck.   If you have been prescribed a medicine to stop a migraine headache, use this at the first warning sign of the headache for best results. First signs may be an aura or pain.   If you need to take medicine often for your migraine, talk with your healthcare provider about other ways to prevent your headaches.  Follow-up care  Follow up with your healthcare provider, or as advised. Talk with your provider if   you have frequent headaches. He or she can figure out a treatment plan. Ask if you can have medicine to take at home the next time you get a bad headache. This may keep you from having to visit the emergency department in the future. You may need to see a headache specialist (neurologist) if you continue to have headaches.  When to seek medical advice  Call your healthcare provider right awayif any of these occur:   Your head pain gets worse, or doesn't get better within 24 hours   You can't keep liquids down (repeated vomiting)   Pain in your sinuses, ears, or  throat   Fever of 100.4 F (38 C) or higher, or as directed by your healthcare provider   Stiff neck   Extreme drowsiness, confusion, or fainting   Dizziness, or dizziness with spinning sensation (vertigo)   Weakness in an arm or leg, or on one side of your face   Difficulty talking or seeing  Date Last Reviewed: 08/14/2014   2000-2016 [employer] [address], [place] [address]. All rights reserved. This information is not intended as a substitute for professional medical care. Always follow your healthcare professional's instructions.

## 2015-11-13 NOTE — ED Triage Notes (Signed)
Patient arrived via EMS for headache, photosensitivity, and high blood pressure that began at approx 12 today. She states she went to pharmacy and her BP was 200/100. She takes Metoprolol 50mg  once daily, she took 2 doses today at 0700 and again at 1200.
